# Patient Record
Sex: Male | Born: 1965 | Race: White | Hispanic: No | Marital: Married | Smoking: Current some day smoker
Health system: Southern US, Community
[De-identification: ages and names within clinical notes are randomized; demographics above are authoritative.]

## PROBLEM LIST (undated history)

## (undated) DIAGNOSIS — R011 Cardiac murmur, unspecified: Secondary | ICD-10-CM

## (undated) DIAGNOSIS — T7840XA Allergy, unspecified, initial encounter: Secondary | ICD-10-CM

## (undated) HISTORY — PX: CHOLECYSTECTOMY: SHX55

## (undated) HISTORY — DX: Cardiac murmur, unspecified: R01.1

## (undated) HISTORY — DX: Allergy, unspecified, initial encounter: T78.40XA

## (undated) HISTORY — PX: APPENDECTOMY: SHX54

---

## 1997-07-08 ENCOUNTER — Encounter: Admission: RE | Admit: 1997-07-08 | Discharge: 1997-10-06 | Payer: Self-pay

## 1997-07-20 ENCOUNTER — Encounter: Admission: RE | Admit: 1997-07-20 | Discharge: 1997-07-20 | Payer: Self-pay | Admitting: Family Medicine

## 1997-08-16 ENCOUNTER — Encounter: Admission: RE | Admit: 1997-08-16 | Discharge: 1997-08-16 | Payer: Self-pay | Admitting: Family Medicine

## 2009-07-19 ENCOUNTER — Telehealth: Payer: Self-pay | Admitting: Cardiovascular Disease

## 2010-05-11 NOTE — Progress Notes (Signed)
Summary: question  Phone Note Outgoing Call   Call placed by: Cala Bradford  Call placed to: Patient Summary of Call: called pt to see if he was going to con't to follow with gollan pt said that he would see gollan here in [place]. he will go and sign records @ [facility] and then we can call and have his appt set up for sometime in May  pt phone number [phone] Initial call taken by: Mercer Pod,  July 19, 2009 4:36 PM

## 2014-09-20 ENCOUNTER — Ambulatory Visit (INDEPENDENT_AMBULATORY_CARE_PROVIDER_SITE_OTHER): Payer: Managed Care, Other (non HMO)

## 2014-09-20 ENCOUNTER — Ambulatory Visit (INDEPENDENT_AMBULATORY_CARE_PROVIDER_SITE_OTHER): Payer: Managed Care, Other (non HMO) | Admitting: Family Medicine

## 2014-09-20 VITALS — BP 120/80 | HR 53 | Temp 98.8°F | Resp 12 | Ht 67.0 in | Wt 176.1 lb

## 2014-09-20 DIAGNOSIS — R0789 Other chest pain: Secondary | ICD-10-CM | POA: Diagnosis not present

## 2014-09-20 DIAGNOSIS — R05 Cough: Secondary | ICD-10-CM | POA: Diagnosis not present

## 2014-09-20 DIAGNOSIS — R059 Cough, unspecified: Secondary | ICD-10-CM

## 2014-09-20 MED ORDER — HYDROCODONE-HOMATROPINE 5-1.5 MG/5ML PO SYRP: 5.0000 mL | ORAL_SOLUTION | Freq: Three times a day (TID) | ORAL | Status: DC | PRN

## 2014-09-20 MED ORDER — AZITHROMYCIN 250 MG PO TABS: ORAL_TABLET | ORAL | Status: DC

## 2014-09-20 NOTE — Patient Instructions (Signed)

## 2014-09-20 NOTE — Progress Notes (Signed)
Patient ID: Alexander Benitez MRN: [id number], DOB: Nov 17, 1965, 49 y.o. Date of Encounter: 09/20/2014, 6:23 PM  Primary Physician: No PCP Per Patient  Chief Complaint:  Chief Complaint  Patient presents with  . Cough    with chest congestion x 3 weeks-worse at night  . Wheezing    HPI: 49 y.o. year old male presents with a 21 day history of nasal congestion, post nasal drip, sore throat, and cough. Mild sinus pressure. Afebrile. No chills. Nasal congestion thick and green/yellow. Cough is productive of green/yellow sputum and associated with bedtime. Ears feel full, leading to sensation of muffled hearing. Has tried OTC cold preps without success. No GI complaints.   No sick contacts, recent antibiotics, or recent travels.   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  No h/o asthma.  No leg trauma, sedentary periods, h/o cancer, or tobacco use(quit years ago)  Past Medical History  Diagnosis Date  . Allergy   . Heart murmur      Home Meds: Prior to Admission medications   Medication Sig Start Date End Date Taking? Authorizing Provider  triamcinolone (NASACORT ALLERGY 24HR) 55 MCG/ACT AERO nasal inhaler Place 2 sprays into the nose daily.   Yes Historical Provider, MD    Allergies: No Known Allergies  History   Social History  . Marital Status: Married    Spouse Name: N/A  . Number of Children: N/A  . Years of Education: N/A   Occupational History  . Not on file.   Social History Main Topics  . Smoking status: Never Smoker   . Smokeless tobacco: Former Neurosurgeon  . Alcohol Use: 2.4 - 3.6 oz/week    4-6 Standard drinks or equivalent per week  . Drug Use: No  . Sexual Activity: Not on file   Other Topics Concern  . Not on file   Social History Narrative  . No narrative on file     Review of Systems: Constitutional: negative for chills, fever, night sweats or weight changes Cardiovascular: negative for chest pain or palpitations Respiratory: negative for  hemoptysis, wheezing, or shortness of breath Abdominal: negative for abdominal pain, nausea, vomiting or diarrhea Dermatological: negative for rash Neurologic: negative for headache   Physical Exam: Blood pressure 120/80, pulse 53, temperature 98.8 F (37.1 C), temperature source Oral, resp. rate 12, height 5\' 7"  (1.702 m), weight 176 lb 2 oz (79.89 kg), SpO2 98 %., Body mass index is 27.58 kg/(m^2). General: Well developed, well nourished, in no acute distress. Head: Normocephalic, atraumatic, eyes without discharge, sclera non-icteric, nares are congested. Bilateral auditory canals clear, TM's are without perforation, pearly grey with reflective cone of light bilaterally. No sinus TTP. Oral cavity moist, dentition normal. Posterior pharynx with post nasal drip and mild erythema. No peritonsillar abscess or tonsillar exudate. Neck: Supple. No thyromegaly. Full ROM. No lymphadenopathy. Lungs: Coarse breath sounds bilaterally with wheezes, rales, and rhonchi bilaterally, worse on the right. Breathing is unlabored.  Heart: RRR with S1 S2. No murmurs, rubs, or gallops appreciated. Msk:  Strength and tone normal for age. Extremities: No clubbing or cyanosis. No edema. Neuro: Alert and oriented X 3. Moves all extremities spontaneously. CNII-XII grossly in tact. Psych:  Responds to questions appropriately with a normal affect.   [facility] reading (PRIMARY) by  Dr. Milus Glazier. Chest x-ray shows peribronchial cuffing and some heavier than normal markings but no definite infiltrate or cardiomegaly   ASSESSMENT AND PLAN:  49 y.o. year old male with bronchitis. -   ICD-9-CM  ICD-10-CM   1. Cough 786.2 R05 DG Chest 2 View     azithromycin (ZITHROMAX Z-PAK) 250 MG tablet     HYDROcodone-homatropine (HYCODAN) 5-1.5 MG/5ML syrup  2. Chest pressure 786.59 R07.89 DG Chest 2 View     azithromycin (ZITHROMAX Z-PAK) 250 MG tablet   -Tylenol/Motrin prn -Rest/fluids -RTC precautions -RTC 3-5 days if no  improvement  Signed, Elvina Sidle, MD 09/20/2014 6:23 PM

## 2015-01-03 ENCOUNTER — Ambulatory Visit (INDEPENDENT_AMBULATORY_CARE_PROVIDER_SITE_OTHER): Payer: Managed Care, Other (non HMO)

## 2015-01-03 ENCOUNTER — Ambulatory Visit (INDEPENDENT_AMBULATORY_CARE_PROVIDER_SITE_OTHER): Payer: Managed Care, Other (non HMO) | Admitting: Family Medicine

## 2015-01-03 VITALS — BP 136/82 | HR 53 | Temp 98.3°F | Resp 16 | Ht 68.5 in | Wt 179.0 lb

## 2015-01-03 DIAGNOSIS — R079 Chest pain, unspecified: Secondary | ICD-10-CM | POA: Diagnosis not present

## 2015-01-03 DIAGNOSIS — R0789 Other chest pain: Secondary | ICD-10-CM

## 2015-01-03 DIAGNOSIS — R001 Bradycardia, unspecified: Secondary | ICD-10-CM

## 2015-01-03 DIAGNOSIS — I340 Nonrheumatic mitral (valve) insufficiency: Secondary | ICD-10-CM

## 2015-01-03 DIAGNOSIS — I341 Nonrheumatic mitral (valve) prolapse: Secondary | ICD-10-CM | POA: Diagnosis not present

## 2015-01-03 LAB — COMPLETE METABOLIC PANEL WITHOUT GFR
Albumin: 5 g/dL (ref 3.6–5.1)
Chloride: 100 mmol/L (ref 98–110)
Glucose, Bld: 78 mg/dL (ref 65–99)
Potassium: 4.3 mmol/L (ref 3.5–5.3)
Sodium: 137 mmol/L (ref 135–146)

## 2015-01-03 LAB — LIPID PANEL
Cholesterol: 203 mg/dL — ABNORMAL HIGH (ref 125–200)
HDL: 44 mg/dL (ref >=40)
LDL Cholesterol: 139 mg/dL — ABNORMAL HIGH (ref <130)
Total CHOL/HDL Ratio: 4.6 Ratio (ref <=5.0)
Triglycerides: 99 mg/dL (ref <150)
VLDL: 20 mg/dL (ref <30)

## 2015-01-03 LAB — POCT CBC
Granulocyte percent: 71.9 % (ref 37–80)
HCT, POC: 50.7 % (ref 43.5–53.7)
Hemoglobin: 16.8 g/dL (ref 14.1–18.1)
Lymph, poc: 2.9 (ref 0.6–3.4)
MCH, POC: 31.1 pg (ref 27–31.2)
MCHC: 33.1 g/dL (ref 31.8–35.4)
MCV: 93.8 fL (ref 80–97)
MID (cbc): 0.5 (ref 0–0.9)
MPV: 6.8 fL (ref 0–99.8)
POC Granulocyte: 8.8 — AB (ref 2–6.9)
POC LYMPH PERCENT: 24.1 %L (ref 10–50)
POC MID %: 4 %M (ref 0–12)
Platelet Count, POC: 271 10*3/uL (ref 142–424)
RBC: 5.41 M/uL (ref 4.69–6.13)
RDW, POC: 13.7 %
WBC: 12.2 10*3/uL — AB (ref 4.6–10.2)

## 2015-01-03 LAB — COMPLETE METABOLIC PANEL WITH GFR
ALT: 37 U/L (ref 9–46)
AST: 27 U/L (ref 10–40)
Alkaline Phosphatase: 76 U/L (ref 40–115)
BUN: 10 mg/dL (ref 7–25)
CO2: 30 mmol/L (ref 20–31)
Calcium: 10 mg/dL (ref 8.6–10.3)
Creat: 0.81 mg/dL (ref 0.60–1.35)
GFR, Est African American: >89 mL/min (ref >=60)
GFR, Est Non African American: >89 mL/min (ref >=60)
Total Bilirubin: 0.8 mg/dL (ref 0.2–1.2)
Total Protein: 7.5 g/dL (ref 6.1–8.1)

## 2015-01-03 NOTE — Patient Instructions (Signed)
Chest Pain (Nonspecific) It is often hard to give a specific diagnosis for the cause of chest pain. There is always a chance that your pain could be related to something serious, such as a heart attack or a blood clot in the lungs. You need to follow up with your health care provider for further evaluation. CAUSES   Heartburn.  Pneumonia or bronchitis.  Anxiety or stress.  Inflammation around your heart (pericarditis) or lung (pleuritis or pleurisy).  A blood clot in the lung.  A collapsed lung (pneumothorax). It can develop suddenly on its own (spontaneous pneumothorax) or from trauma to the chest.  Shingles infection (herpes zoster virus). The chest wall is composed of bones, muscles, and cartilage. Any of these can be the source of the pain.  The bones can be bruised by injury.  The muscles or cartilage can be strained by coughing or overwork.  The cartilage can be affected by inflammation and become sore (costochondritis). DIAGNOSIS  Lab tests or other studies may be needed to find the cause of your pain. Your health care provider may have you take a test called an ambulatory electrocardiogram (ECG). An ECG records your heartbeat patterns over a 24-hour period. You may also have other tests, such as:  Transthoracic echocardiogram (TTE). During echocardiography, sound waves are used to evaluate how blood flows through your heart.  Transesophageal echocardiogram (TEE).  Cardiac monitoring. This allows your health care provider to monitor your heart rate and rhythm in real time.  Holter monitor. This is a portable device that records your heartbeat and can help diagnose heart arrhythmias. It allows your health care provider to track your heart activity for several days, if needed.  Stress tests by exercise or by giving medicine that makes the heart beat faster. TREATMENT   Treatment depends on what may be causing your chest pain. Treatment may include:  Acid blockers for  heartburn.  Anti-inflammatory medicine.  Pain medicine for inflammatory conditions.  Antibiotics if an infection is present.  You may be advised to change lifestyle habits. This includes stopping smoking and avoiding alcohol, caffeine, and chocolate.  You may be advised to keep your head raised (elevated) when sleeping. This reduces the chance of acid going backward from your stomach into your esophagus. Most of the time, nonspecific chest pain will improve within 2-3 days with rest and mild pain medicine.  HOME CARE INSTRUCTIONS   If antibiotics were prescribed, take them as directed. Finish them even if you start to feel better.  For the next few days, avoid physical activities that bring on chest pain. Continue physical activities as directed.  Do not use any tobacco products, including cigarettes, chewing tobacco, or electronic cigarettes.  Avoid drinking alcohol.  Only take medicine as directed by your health care provider.  Follow your health care provider's suggestions for further testing if your chest pain does not go away.  Keep any follow-up appointments you made. If you do not go to an appointment, you could develop lasting (chronic) problems with pain. If there is any problem keeping an appointment, call to reschedule. SEEK MEDICAL CARE IF:   Your chest pain does not go away, even after treatment.  You have a rash with blisters on your chest.  You have a fever. SEEK IMMEDIATE MEDICAL CARE IF:   You have increased chest pain or pain that spreads to your arm, neck, jaw, back, or abdomen.  You have shortness of breath.  You have an increasing cough, or you cough  up blood.  You have severe back or abdominal pain.  You feel nauseous or vomit.  You have severe weakness.  You faint.  You have chills. This is an emergency. Do not wait to see if the pain will go away. Get medical help at once. Call your local emergency services (911 in [place] Do not drive  yourself to the hospital. MAKE SURE YOU:   Understand these instructions.  Will watch your condition.  Will get help right away if you are not doing well or get worse. Document Released: 01/03/2005 Document Revised: 03/31/2013 Document Reviewed: 10/30/2007 [facility] Patient Information 2015 [place], [place]. This information is not intended to replace advice given to you by your health care provider. Make sure you discuss any questions you have with your health care provider. Mitral Valve Prolapse The mitral valve is located between the top and bottom parts of the heart on the left side. A mitral valve prolapse (MVP) is an abnormal bulging of 1 or both of the 2 mitral leaflets. The valve bulges into the top chamber (atrium) of the heart when the bottom chamber (ventricle) squeezes or contracts. MVP is more common in females. It is an inherited problem and is usually not found until adolescence. It is not harmful and rarely needs other treatment. PROBLEMS MAY INCLUDE:  Chest pain.  Palpitations.  Anxiety.  Panic attacks.  Stroke, rarely. HOME CARE INSTRUCTIONS   Taking antibiotics before a dental or other medical procedure is no longer routine. Consult with your caregiver.  Exercise as your caregiver instructs.  Discuss cardiac risk factors associated with MVP with your caregiver. SEEK IMMEDIATE MEDICAL CARE IF:   You develop frequent episodes of chest pain or an irregular heartbeat.  You faint or pass out.  You have severe chest pain or shortness of breath.  You develop palpitations with weakness or dizziness.  You have difficulty with vision or swallowing or weakness or numbness on one side of your body. MAKE SURE YOU:   Understand these instructions.  Will watch your condition.  Will get help right away if you are not doing well or get worse. Document Released: 03/23/2000 Document Revised: 06/18/2011 Document Reviewed: 05/23/2007 [facility] Patient Information 2015  [place], [place]. This information is not intended to replace advice given to you by your health care provider. Make sure you discuss any questions you have with your health care provider.

## 2015-01-03 NOTE — Progress Notes (Signed)
Chief Complaint:  Chief Complaint  Patient presents with  . Abdominal Pain    mid-sternum X 1 week    HPI: Alexander Benitez is a 49 y.o. male who reports to [facility] today complaining of midchest pain radiating to both sides starting about 9 days ago, he has no other sxs. It starts at his xiphoid and primarily goes to the right right side. He has a hx of a heart murmur but denies any HA, n/v/ weakness, diaphoresis, SOB, leg swelling. He is not sure if he has palpitations. He has a labor intensive job so not sure if he overexerted himself. He works in Dentist, he lifts both  light and heavy things. He used to run but has not been able to. He has had issues with heart burn in the pat. He has woken  up a lot at night sweating. He has been doing this for about  1 year off and on. He stopped smoking 5 years ago and was smoking about 20 years x 1 ppd. He works in [employer] , exposed to phosphoric acid. He has had no unintentonal weight loss, he had a cough several months ago andthis took a couple months to resolved even after abx. He was given a z pack and after a couple of weeks after that it was finely  better.  He has a sharp 4/10 pain when he bends over and then it resolves. He has no CHF sxs.   Biological father had 3 heart attacks at age 28.  He also had liver disease and so did his father's sister and mother, he does not know what it is called.  He has minimally high choleseterol which was tested through his work, marginally high. He denies diabetes.  He has been evaluated by cardiology in the past.   We were able to pull his old chart and he was seen by Dr Gunnar Bulla at [facility] in [place] several years back  And had a stress echo and was found to have mitral regurgiation, with MVP, trace tricuspid and also pulmonic regurgiationa s well.  He has not followed up with him for awhile since the practice is closed? He was put on metoprolol but that made  his heart rate go below 45 and he felt awful.     Past Medical History  Diagnosis Date  . Allergy   . Heart murmur    Past Surgical History  Procedure Laterality Date  . Appendectomy    . Cholecystectomy     Social History   Social History  . Marital Status: Married    Spouse Name: N/A  . Number of Children: N/A  . Years of Education: N/A   Social History Main Topics  . Smoking status: Never Smoker   . Smokeless tobacco: Former Neurosurgeon  . Alcohol Use: 2.4 - 3.6 oz/week    4-6 Standard drinks or equivalent per week  . Drug Use: No  . Sexual Activity: Not Asked   Other Topics Concern  . None   Social History Narrative   Family History  Problem Relation Age of Onset  . Adopted: Yes  . Cancer Father   . Heart disease Father    No Known Allergies Prior to Admission medications   Medication Sig Start Date End Date Taking? Authorizing Provider  azithromycin (ZITHROMAX Z-PAK) 250 MG tablet 2-1-1-1-1 daily Patient not taking: Reported on 01/03/2015 09/20/14   Elvina Sidle, MD  HYDROcodone-homatropine [facility]) 5-1.5 MG/5ML  syrup Take 5 mLs by mouth every 8 (eight) hours as needed for cough. Patient not taking: Reported on 01/03/2015 09/20/14   Elvina Sidle, MD  triamcinolone (NASACORT ALLERGY 24HR) 55 MCG/ACT AERO nasal inhaler Place 2 sprays into the nose daily.    Historical Provider, MD     ROS: The patient denies fevers, chills,  unintentional weight loss,  palpitations, wheezing, dyspnea on exertion, nausea, vomiting, dysuria, hematuria, melena, numbness, weakness, or tingling.  All other systems have been reviewed and were otherwise negative with the exception of those mentioned in the HPI and as above.    PHYSICAL EXAM: Filed Vitals:   01/03/15 1626  BP: 136/82  Pulse: 53  Temp: 98.3 F (36.8 C)  Resp: 16   Body mass index is 26.82 kg/(m^2).   General: Alert, no acute distress HEENT:  Normocephalic, atraumatic, oropharynx patent. EOMI, PERRLA, fundo  exam ormal, Tm nl, no thyroidmegaly Cardiovascular:  Sinus brady, reg rhythm, no rubs or gallops.  + 3/6 systolic murmur with click. No Carotid bruits, radial pulse intact. No pedal edema.  Respiratory: Clear to auscultation bilaterally.  No wheezes, rales, or rhonchi.  No cyanosis, no use of accessory musculature Abdominal: No organomegaly, abdomen is soft and non-tender, positive bowel sounds. No masses. Skin: No rashes. Neurologic: Facial musculature symmetric. CN 2-12 grossly normal  Psychiatric: Patient acts appropriately throughout our interaction. Lymphatic: No cervical or submandibular lymphadenopathy Musculoskeletal: Gait intact. No edema, tenderness + tenderness at xiphoid but not on chest wall on palpation Diaphragm movement is normal bilateally   LABS: Results for orders placed or performed in visit on 01/03/15  POCT CBC  Result Value Ref Range   WBC 12.2 (A) 4.6 - 10.2 K/uL   Lymph, poc 2.9 0.6 - 3.4   POC LYMPH PERCENT 24.1 10 - 50 %L   MID (cbc) 0.5 0 - 0.9   POC MID % 4.0 0 - 12 %M   POC Granulocyte 8.8 (A) 2 - 6.9   Granulocyte percent 71.9 37 - 80 %G   RBC 5.41 4.69 - 6.13 M/uL   Hemoglobin 16.8 14.1 - 18.1 g/dL   HCT, POC 16.1 09.6 - 53.7 %   MCV 93.8 80 - 97 fL   MCH, POC 31.1 27 - 31.2 pg   MCHC 33.1 31.8 - 35.4 g/dL   RDW, POC 04.5 %   Platelet Count, POC 271 142 - 424 K/uL   MPV 6.8 0 - 99.8 fL     EKG/XRAY:   Primary read interpreted by Dr. Conley Rolls at [facility]. No acute cardiopulm process EKG sinus brady without ST changes, he has sinus brady on prior EKG as well   ASSESSMENT/PLAN: Encounter Diagnoses  Name Primary?  . Chest pain, unspecified chest pain type   . Atypical chest pain Yes  . Sinus bradycardia   . Mitral valve regurgitation   . Mitral valve prolapse    Refer to Cardiology  Labs pending Go to ER prn , precautions given.  Fu prn   Gross sideeffects, risk and benefits, and alternatives of medications d/w patient. Patient is aware that  all medications have potential sideeffects and we are unable to predict every sideeffect or drug-drug interaction that may occur.  Thao Le DO  01/03/2015 6:01 PM   LM about labs, needs referral to cardiology.

## 2015-01-04 DIAGNOSIS — R001 Bradycardia, unspecified: Secondary | ICD-10-CM | POA: Insufficient documentation

## 2015-01-04 DIAGNOSIS — I340 Nonrheumatic mitral (valve) insufficiency: Secondary | ICD-10-CM | POA: Insufficient documentation

## 2015-01-04 DIAGNOSIS — I341 Nonrheumatic mitral (valve) prolapse: Secondary | ICD-10-CM | POA: Insufficient documentation

## 2015-01-04 LAB — TSH: TSH: 0.722 u[IU]/mL (ref 0.350–4.500)

## 2015-01-06 ENCOUNTER — Telehealth: Payer: Self-pay | Admitting: *Deleted

## 2015-01-06 NOTE — Telephone Encounter (Signed)
lmov for pt to call and make referral apt.

## 2015-03-07 ENCOUNTER — Ambulatory Visit: Payer: Self-pay | Admitting: Cardiovascular Disease

## 2015-06-04 ENCOUNTER — Ambulatory Visit (INDEPENDENT_AMBULATORY_CARE_PROVIDER_SITE_OTHER): Payer: Managed Care, Other (non HMO) | Admitting: Internal Medicine

## 2015-06-04 VITALS — BP 130/78 | HR 48 | Temp 98.2°F | Resp 12 | Ht 68.0 in | Wt 183.0 lb

## 2015-06-04 DIAGNOSIS — L290 Pruritus ani: Secondary | ICD-10-CM | POA: Diagnosis not present

## 2015-06-04 MED ORDER — CLOTRIMAZOLE-BETAMETHASONE 1-0.05 % EX CREA: 1.0000 "application " | TOPICAL_CREAM | Freq: Two times a day (BID) | CUTANEOUS | Status: DC

## 2015-06-04 NOTE — Progress Notes (Signed)
   Subjective:  By signing my name below, I, [name], attest that this documentation has been prepared under the direction and in the presence of Ellamae Sia, MD.  Electronically Signed: Andrew Au, ED Scribe. 06/04/2015. 12:11 PM.    Patient ID: Alexander Benitez, male    DOB: [birth year], 50 y.o.   MRN: [id number]  HPI1st [facility] ov Chief Complaint  Patient presents with  . Abdominal Pain    1 month cramping  . Diarrhea    1 month    HPI Comments: Alexander Benitez is a 50 y.o. male who presents to the [facility] complaining of anal irritation. Pt reports itching to the anal area, worse in the morning when showering. He reports fluctuating between constipation and normal stools. He has hx of hemorrhoids, last flare summer 2016. He has tried preparation H, A&D ointment and keeping areas clean with wipes with temporary relief. He has not had colonoscopy. He denies family hx of colon cancer. He denies pertinent medical hx including HTN, DM and HLD.   Past Medical History  Diagnosis Date  . Allergy   . Heart murmur    Past Surgical History  Procedure Laterality Date  . Appendectomy    . Cholecystectomy     Prior to Admission medications   Medication Sig Start Date End Date Taking? Authorizing Provider  triamcinolone (NASACORT ALLERGY 24HR) 55 MCG/ACT AERO nasal inhaler Place 2 sprays into the nose daily.   Yes Historical Provider, MD  clotrimazole-betamethasone (LOTRISONE) cream Apply 1 application topically 2 (two) times daily. 06/04/15   Tonye Pearson, MD   Review of Systems  Constitutional: Negative for fever and chills.  Gastrointestinal: Positive for constipation. Negative for diarrhea.  Skin: Positive for rash.   Objective:   Physical Exam  Constitutional: He is oriented to person, place, and time. He appears well-developed and well-nourished. No distress.  HENT:  Head: Normocephalic and atraumatic.  Eyes: Conjunctivae and EOM are normal.  Neck:  Neck supple.  Cardiovascular: Normal rate.   Pulmonary/Chest: Effort normal.  Genitourinary:  Dry, irritated, perianal region without lesions  Musculoskeletal: Normal range of motion.  Neurological: He is alert and oriented to person, place, and time.  Skin: Skin is warm and dry.  Psychiatric: He has a normal mood and affect. His behavior is normal.  Nursing note and vitals reviewed.   Filed Vitals:   06/04/15 1103  BP: 130/78  Pulse: 48  Temp: 98.2 F (36.8 C)  TempSrc: Oral  Resp: 12  Height:  (1.727 m)  Weight: 183 lb (83.008 kg)  SpO2: 98%       Assessment & Plan:   1. Pruritus ani     Meds ordered this encounter  Medications  . clotrimazole-betamethasone (LOTRISONE) cream for 2 weeks    Sig: Apply 1 application topically 2 (two) times daily.    Dispense:  30 g    Refill:  0  baby wipes p bm A&D after stool  I have completed the patient encounter in its entirety as documented by the scribe, with editing by me where necessary. Kaydon Husby P. Merla Riches, M.D.

## 2015-07-09 ENCOUNTER — Ambulatory Visit (INDEPENDENT_AMBULATORY_CARE_PROVIDER_SITE_OTHER): Payer: Managed Care, Other (non HMO) | Admitting: Family Medicine

## 2015-07-09 VITALS — BP 147/83 | HR 48 | Temp 97.6°F | Resp 16 | Ht 68.0 in | Wt 190.2 lb

## 2015-07-09 DIAGNOSIS — L237 Allergic contact dermatitis due to plants, except food: Secondary | ICD-10-CM | POA: Diagnosis not present

## 2015-07-09 MED ORDER — METHYLPREDNISOLONE ACETATE 80 MG/ML IJ SUSP
120.0000 mg | Freq: Once | INTRAMUSCULAR | Status: AC
  Administered 2015-07-09: 120 mg via INTRAMUSCULAR

## 2015-07-09 NOTE — Progress Notes (Signed)
Patient ID: Alexander Benitez MRN: [id number], DOB: [birth year], 50 y.o. Date of Encounter: 07/09/2015, 11:25 AM  Primary Physician: Rockne Coons, DO  Chief Complaint: Pruritic rash  HPI: 50 y.o. year old male with presents with 3 day history of mildly erythematous pruritic rash along arms and face. Patient was doing yard work prior to the development of the rash. Known poison ivy in the vicinity. Has not yet washed all clothing or linens that have been exposed. Lesions now weeping clear fluid. Has tried OTC creams.  Patient is otherwise doing well without issues or complaints.  Past Medical History  Diagnosis Date  . Allergy   . Heart murmur      Home Meds: Prior to Admission medications   Medication Sig Start Date End Date Taking? Authorizing Provider  clotrimazole-betamethasone (LOTRISONE) cream Apply 1 application topically 2 (two) times daily. Patient not taking: Reported on 07/09/2015 06/04/15   Tonye Pearson, MD  triamcinolone (NASACORT ALLERGY 24HR) 55 MCG/ACT AERO nasal inhaler Place 2 sprays into the nose daily. Reported on 07/09/2015    Historical Provider, MD    Allergies: No Known Allergies  Social History   Social History  . Marital Status: Married    Spouse Name: N/A  . Number of Children: N/A  . Years of Education: N/A   Occupational History  . Not on file.   Social History Main Topics  . Smoking status: Never Smoker   . Smokeless tobacco: Former Neurosurgeon  . Alcohol Use: 2.4 - 3.6 oz/week    4-6 Standard drinks or equivalent per week  . Drug Use: No  . Sexual Activity: Not on file   Other Topics Concern  . Not on file   Social History Narrative     Review of Systems: Constitutional: negative for chills, fever, night sweats, weight changes, or fatigue  HEENT: negative for vision changes, hearing loss, congestion, rhinorrhea, ST, epistaxis, or sinus pressure Cardiovascular: negative for chest pain or palpitations Respiratory: negative for hemoptysis,  wheezing, shortness of breath, or cough Abdominal: negative for abdominal pain, nausea, vomiting, diarrhea, or constipation Dermatological: see above Neurologic: negative for headache, dizziness, or syncope   Physical Exam: Blood pressure 147/83, pulse 48, temperature 97.6 F (36.4 C), temperature source Oral, resp. rate 16, height  (1.727 m), weight 190 lb 3.2 oz (86.274 kg), SpO2 98 %., Body mass index is 28.93 kg/(m^2). General: Well developed, well nourished, in no acute distress. Head: Normocephalic, atraumatic, eyes without discharge, sclera non-icteric, nares are without discharge. Bilateral auditory canals clear, TM's are without perforation, pearly grey and translucent with reflective cone of light bilaterally. Oral cavity moist, posterior pharynx without exudate, erythema, peritonsillar abscess, or post nasal drip.  Neck: Supple. No thyromegaly. Full ROM. No lymphadenopathy. Lungs: Clear bilaterally to auscultation without wheezes, rales, or rhonchi. Breathing is unlabored. Heart: RRR with S1 S2. No murmurs, rubs, or gallops appreciated. Msk:  Strength and tone normal for age. Extremities/Skin: Warm and dry. Multiple vesicular weeping lesions along forearms, wrists, and both upper lids(worse on left) consistent with poison ivy. No secondary infections present. No clubbing or cyanosis. No edema.  Neuro: Alert and oriented X 3. Moves all extremities spontaneously. Gait is normal. CNII-XII grossly in tact. Psych:  Responds to questions appropriately with a normal affect.     ASSESSMENT AND PLAN:  50 y.o. year old male with poison ivy involving forearms, face and wrists     ICD-9-CM ICD-10-CM   1. Poison ivy 692.6 L23.7 methylPREDNISolone acetate (  DEPO-MEDROL) injection 120 mg   -Zyrtec -Zantac -Benadryl -Wash all contaminated clothes and linens -RTC 10 days if symptoms persist, sooner if they worsen   Signed, Elvina SidleKurt Katlen Seyer, MD 07/09/2015 11:25 AM

## 2015-07-09 NOTE — Patient Instructions (Addendum)

## 2015-12-02 ENCOUNTER — Encounter: Payer: Self-pay | Admitting: Physician Assistant

## 2015-12-02 ENCOUNTER — Ambulatory Visit (INDEPENDENT_AMBULATORY_CARE_PROVIDER_SITE_OTHER): Payer: Managed Care, Other (non HMO) | Admitting: Physician Assistant

## 2015-12-02 VITALS — BP 148/84 | HR 52 | Temp 98.5°F | Resp 17 | Ht 68.5 in | Wt 182.0 lb

## 2015-12-02 DIAGNOSIS — Z79899 Other long term (current) drug therapy: Secondary | ICD-10-CM | POA: Diagnosis not present

## 2015-12-02 DIAGNOSIS — B351 Tinea unguium: Secondary | ICD-10-CM | POA: Diagnosis not present

## 2015-12-02 MED ORDER — TERBINAFINE HCL 250 MG PO TABS: 250.0000 mg | ORAL_TABLET | Freq: Every day | ORAL | 1 refills | Status: DC

## 2015-12-02 NOTE — Progress Notes (Signed)
[facility] [address] [place] [address] [phone] By signing my name below, I, Mesha Guinyard, attest that this documentation has been prepared under the direction and in the presence of Treatment Team:  Attending Provider: Ethelda Chick, MD Physician Assistant: Garnetta Buddy, PA.  Electronically Signed: Arvilla Market, Medical Scribe. 12/02/15. 4:05 PM.  Date:  12/02/2015   Name:  Alexander Benitez   DOB:  Sep 29, 1965   MRN:  [id number]  PCP:  Rockne Coons, DO   Chief Complaint  Patient presents with   Other    nail and toe nail infection    History of Present Illness:  Alexander Benitez is a 50 y.o. male patient who presents to [facility] complaining of left thumb nail and left great toe nail fungus onset a couple of months. Pt noticed his toe nail was white and flaky so he gave him self a pedicure, and a couple of weeks ago his thumb nail started looking fungal like his left great toe. Pt mentions it's starting to spread to his other two adjacent fingers on his left hand. Pt's left thumb nail was pulled off years ago and grew back fine in 6 months after the accident. Pt has tried a OTC fungal medicine that gave no relief to his symptoms. Pt is adopted and his biological dad died of liver disease, but he isn't sure if it was alcohol related. Pt drinks about 3-4 drinks, 2-3 times a week. Pt denies having hx of liver issues.  Patient Active Problem List   Diagnosis Date Noted   Mitral valve regurgitation 01/04/2015   Mitral valve prolapse 01/04/2015   Sinus bradycardia 01/04/2015    Past Medical History:  Diagnosis Date   Allergy    Heart murmur     Past Surgical History:  Procedure Laterality Date   APPENDECTOMY     CHOLECYSTECTOMY      Social History  Substance Use Topics   Smoking status: Current Some Day Smoker   Smokeless tobacco: Former Neurosurgeon   Alcohol use 2.4 - 3.6 oz/week    4 - 6 Standard drinks or equivalent per week     Family History  Problem Relation Age of Onset   Adopted: Yes   Cancer Father    Heart disease Father     No Known Allergies  Medication list has been reviewed and updated.  Current Outpatient Prescriptions on File Prior to Visit  Medication Sig Dispense Refill   triamcinolone (NASACORT ALLERGY 24HR) 55 MCG/ACT AERO nasal inhaler Place 2 sprays into the nose daily. Reported on 07/09/2015     No current facility-administered medications on file prior to visit.     Review of Systems  Skin:       Left great toe nail, and left thumb nail fungus      Physical Examination: BP (!) 148/84 (BP Location: Right Arm, Patient Position: Sitting, Cuff Size: Normal)    Pulse (!) 52    Temp 98.5 F (36.9 C) (Oral)    Resp 17    Ht 5' 8.5" (1.74 m)    Wt 182 lb (82.6 kg)    SpO2 96%    BMI 27.27 kg/m  Ideal Body Weight: @FLOWAMB ([id number])@  Physical Exam  Constitutional: He is oriented to person, place, and time. He appears well-developed and well-nourished. No distress.  HENT:  Head: Normocephalic and atraumatic.  Eyes: Conjunctivae and EOM are normal. Pupils are equal, round, and reactive to light.  Cardiovascular: Normal rate.   Pulmonary/Chest: Effort normal. No respiratory distress.  Neurological: He is alert and oriented to person, place, and time.  Skin: Skin is warm and dry. He is not diaphoretic.  Left first toe with thickened toenail There mild yellowing  No tenderness Left thumb with thickened nail that seems to be elevated off the nail bed and hyperkeratotic, this is also the problem of the left 5th toe  Psychiatric: He has a normal mood and affect. His behavior is normal.   Results for orders placed or performed in visit on 12/02/15  COMPLETE METABOLIC PANEL WITH GFR  Result Value Ref Range   Sodium 139 135 - 146 mmol/L   Potassium 4.0 3.5 - 5.3 mmol/L   Chloride 105 98 - 110 mmol/L   CO2 24 20 - 31 mmol/L   Glucose, Bld 93 65 - 99 mg/dL   BUN 11 7 - 25 mg/dL    Creat 1.610.80 0.960.60 - 0.451.35 mg/dL   Total Bilirubin 0.7 0.2 - 1.2 mg/dL   Alkaline Phosphatase 84 40 - 115 U/L   AST 29 10 - 40 U/L   ALT 46 9 - 46 U/L   Total Protein 7.5 6.1 - 8.1 g/dL   Albumin 5.0 3.6 - 5.1 g/dL   Calcium 40.910.2 8.6 - 81.110.3 mg/dL   GFR, Est African American >89 >=60 mL/min   GFR, Est Non African American >89 >=60 mL/min    Assessment and Plan: Alexander Benitez is a 50 y.o. male who is here today for cc of toe nail discoloration.   --advised that we will check liver enzymes at this time.  If elevated will advise to not take medication.  We will recheck in 6 weeks, hepatic panel and toe nail.  Patient voiced understanding.   Onychomycosis - Plan: COMPLETE METABOLIC PANEL WITH GFR, terbinafine (LAMISIL) 250 MG tablet, Care order/instruction:  Medication management - Plan: COMPLETE METABOLIC PANEL WITH GFR, Care order/instruction:  Trena PlattStephanie English, PA-C [facility] 12/02/2015 4:05 PM I personally performed the services described in this documentation, which was scribed in my presence. The recorded information has been reviewed and is accurate.

## 2015-12-02 NOTE — Patient Instructions (Addendum)
This is a 12 week course.  Take daily.  Please return in 6 weeks for recheck. Make sure that you are hydrating and eating fresh fruits and vegetables. Avoid alcohol intake at this time.  Nail Ringworm A fungal infection of the nail (tinea unguium/onychomycosis) is common. It is common as the visible part of the nail is composed of dead cells which have no blood supply to help prevent infection. It occurs because fungi are everywhere and will pick any opportunity to grow on any dead material. Because nails are very slow growing they require up to 2 years of treatment with anti-fungal medications. The entire nail back to the base is infected. This includes approximately  of the nail which you cannot see. If your caregiver has prescribed a medication by mouth, take it every day and as directed. No progress will be seen for at least 6 to 9 months. Do not be disappointed! Because fungi live on dead cells with little or no exposure to blood supply, medication delivery to the infection is slow; thus the cure is slow. It is also why you can observe no progress in the first 6 months. The nail becoming cured is the base of the nail, as it has the blood supply. Topical medication such as creams and ointments are usually not effective. Important in successful treatment of nail fungus is closely following the medication regimen that your doctor prescribes. Sometimes you and your caregiver may elect to speed up this process by surgical removal of all the nails. Even this may still require 6 to 9 months of additional oral medications. See your caregiver as directed. Remember there will be no visible improvement for at least 6 months. See your caregiver sooner if other signs of infection (redness and swelling) develop.   This information is not intended to replace advice given to you by your health care provider. Make sure you discuss any questions you have with your health care provider.   Document Released: 03/23/2000  Document Revised: 08/10/2014 Document Reviewed: 09/27/2014 Elsevier Interactive Patient Education 2016 [employer]    IF you received an x-ray today, you will receive an invoice from [facility]. Please contact [facility] at [phone] with questions or concerns regarding your invoice.   IF you received labwork today, you will receive an invoice from [facility]. Please contact [facility] at [phone] with questions or concerns regarding your invoice.   Our billing staff will not be able to assist you with questions regarding bills from these companies.  You will be contacted with the lab results as soon as they are available. The fastest way to get your results is to activate your My Chart account. Instructions are located on the last page of this paperwork. If you have not heard from us regarding the results in 2 weeks, please contact this office.

## 2015-12-03 LAB — COMPLETE METABOLIC PANEL WITH GFR
ALBUMIN: 5 g/dL (ref 3.6–5.1)
ALK PHOS: 84 U/L (ref 40–115)
ALT: 46 U/L (ref 9–46)
AST: 29 U/L (ref 10–40)
BUN: 11 mg/dL (ref 7–25)
CALCIUM: 10.2 mg/dL (ref 8.6–10.3)
CO2: 24 mmol/L (ref 20–31)
Chloride: 105 mmol/L (ref 98–110)
Creat: 0.8 mg/dL (ref 0.60–1.35)
GFR, Est African American: >89 mL/min (ref >=60)
Glucose, Bld: 93 mg/dL (ref 65–99)
POTASSIUM: 4 mmol/L (ref 3.5–5.3)
SODIUM: 139 mmol/L (ref 135–146)
Total Bilirubin: 0.7 mg/dL (ref 0.2–1.2)
Total Protein: 7.5 g/dL (ref 6.1–8.1)

## 2015-12-06 ENCOUNTER — Telehealth: Payer: Self-pay

## 2015-12-06 NOTE — Telephone Encounter (Signed)
Pt wants lab results - saw English last Friday.  [phone]

## 2015-12-07 NOTE — Telephone Encounter (Signed)
Liver and kidney function are normal.  Normal liver enzymes.  Please start the medication if you have not done so.  Follow up in 6 weeks for hepatic panel.

## 2015-12-07 NOTE — Telephone Encounter (Signed)
Please review and send to the lab please.

## 2015-12-08 NOTE — Telephone Encounter (Signed)
lmom with lab results and to call back with any questions or concerns.

## 2016-02-08 ENCOUNTER — Ambulatory Visit (INDEPENDENT_AMBULATORY_CARE_PROVIDER_SITE_OTHER): Payer: Managed Care, Other (non HMO) | Admitting: Physician Assistant

## 2016-02-08 VITALS — BP 124/78 | HR 55 | Temp 98.5°F | Resp 16 | Wt 185.0 lb

## 2016-02-08 DIAGNOSIS — B351 Tinea unguium: Secondary | ICD-10-CM

## 2016-02-08 DIAGNOSIS — Z79899 Other long term (current) drug therapy: Secondary | ICD-10-CM | POA: Diagnosis not present

## 2016-02-08 LAB — HEPATIC FUNCTION PANEL
ALBUMIN: 4.8 g/dL (ref 3.6–5.1)
ALT: 42 U/L (ref 9–46)
AST: 29 U/L (ref 10–40)
Alkaline Phosphatase: 77 U/L (ref 40–115)
BILIRUBIN DIRECT: 0.1 mg/dL (ref <=0.2)
BILIRUBIN TOTAL: 0.8 mg/dL (ref 0.2–1.2)
Indirect Bilirubin: 0.7 mg/dL (ref 0.2–1.2)
Total Protein: 7.3 g/dL (ref 6.1–8.1)

## 2016-02-08 MED ORDER — TERBINAFINE HCL 250 MG PO TABS: 250.0000 mg | ORAL_TABLET | Freq: Every day | ORAL | 0 refills | Status: AC

## 2016-02-08 NOTE — Patient Instructions (Signed)
Continue medication and follow up in 4-5 weeks.

## 2016-02-10 NOTE — Progress Notes (Signed)
[facility] [address], [place] [id number] [phone]  Date:  02/08/2016   Name:  Alexander PattyWalter W Benitez   DOB:  05/22/1965   MRN:  [id number]  PCP:  Rockne Coonshao Phuong Le, DO    History of Present Illness:  Alexander Benitez is a 50 y.o. male patient who [facility] medication refill and recheck of his toenails.  Patient was seen about 8 weeks ago for onychomycosis. He was given a hepatic function test before starting him on delayed terbinafine. He has been compliant with taking the medication. He states he only missed about 2 doses from the whole time. He states that he has noticed a change in his toenails and his fingernails however they continue to be mildly yellow.     Patient Active Problem List   Diagnosis Date Noted  . Mitral valve regurgitation 01/04/2015  . Mitral valve prolapse 01/04/2015  . Sinus bradycardia 01/04/2015    Past Medical History:  Diagnosis Date  . Allergy   . Heart murmur     Past Surgical History:  Procedure Laterality Date  . APPENDECTOMY    . CHOLECYSTECTOMY      Social History  Substance Use Topics  . Smoking status: Current Some Day Smoker  . Smokeless tobacco: Former NeurosurgeonUser  . Alcohol use 2.4 - 3.6 oz/week    4 - 6 Standard drinks or equivalent per week    Family History  Problem Relation Age of Onset  . Adopted: Yes  . Cancer Father   . Heart disease Father     No Known Allergies  Medication list has been reviewed and updated.  Current Outpatient Prescriptions on File Prior to Visit  Medication Sig Dispense Refill  . triamcinolone (NASACORT ALLERGY 24HR) 55 MCG/ACT AERO nasal inhaler Place 2 sprays into the nose daily. Reported on 07/09/2015     No current facility-administered medications on file prior to visit.     ROS ROS otherwise unremarkable unless listed above.   Physical Examination: BP 124/78 (BP Location: Right Arm, Cuff Size: Normal)   Pulse (!) 55   Temp 98.5 F (36.9 C) (Oral)   Resp 16    Wt 185 lb (83.9 kg)   SpO2 98%   BMI 27.72 kg/m  Ideal Body Weight:    Physical Exam  Constitutional: He is oriented to person, place, and time. He appears well-developed and well-nourished. No distress.  HENT:  Head: Normocephalic and atraumatic.  Eyes: Conjunctivae and EOM are normal. Pupils are equal, round, and reactive to light.  Cardiovascular: Normal rate.   Pulmonary/Chest: Effort normal. No respiratory distress.  Neurological: He is alert and oriented to person, place, and time.  Skin: Skin is warm and dry. He is not diaphoretic.  Psychiatric: He has a normal mood and affect. His behavior is normal.     Assessment and Plan: Alexander PattyWalter W Benitez is a 50 y.o. male who is here today for follow up of hepatic function --liver function recheck.  Continue terbinafine pending lab results.  1. Medication management - Hepatic Function Panel  2. Onychomycosis - terbinafine (LAMISIL) 250 MG tablet; Take 1 tablet (250 mg total) by mouth daily.  Dispense: 30 tablet; Refill: 0   Trena PlattStephanie English, PA-C [facility] 02/10/2016 9:44 AM

## 2016-06-07 IMAGING — CR DG CHEST 2V
2 series · 2 of 2 positions shown · non-contrast
Comparison: None.

CLINICAL DATA: Cough, congestion wheezing.

EXAM:
CHEST  2 VIEW

[PA]
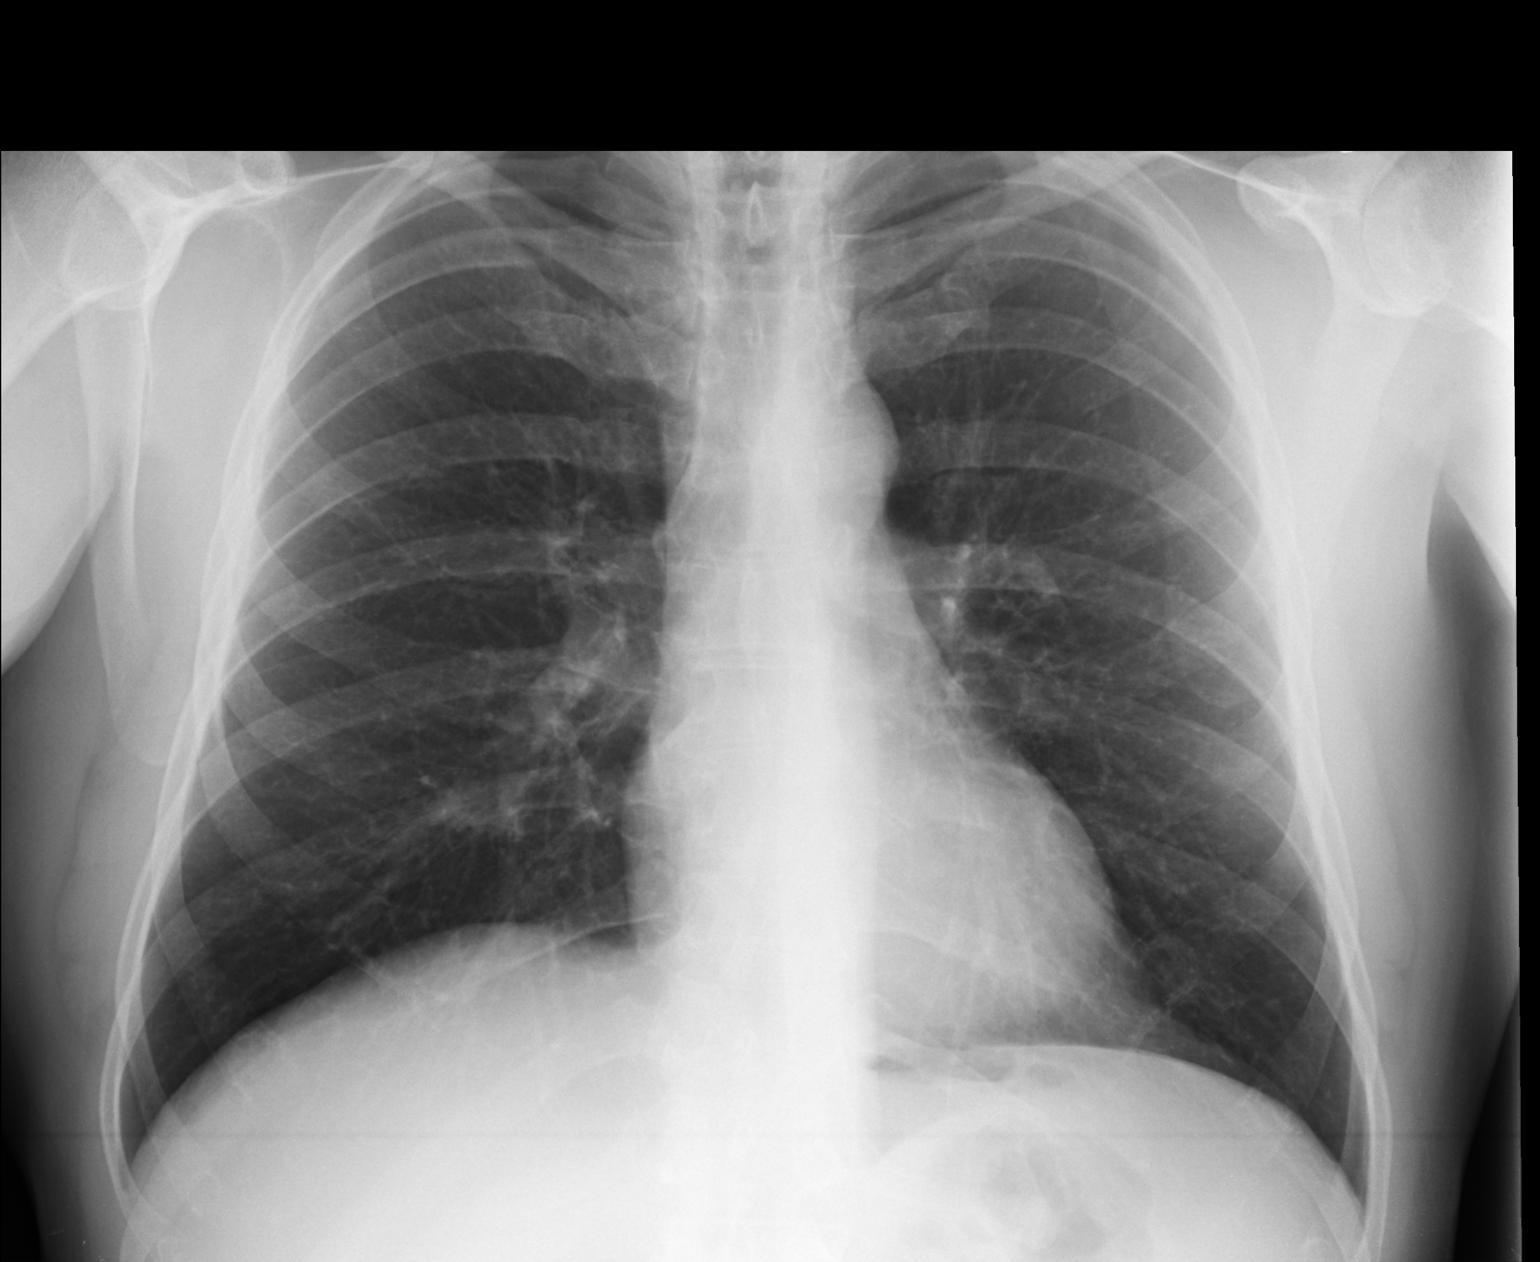

[lateral]
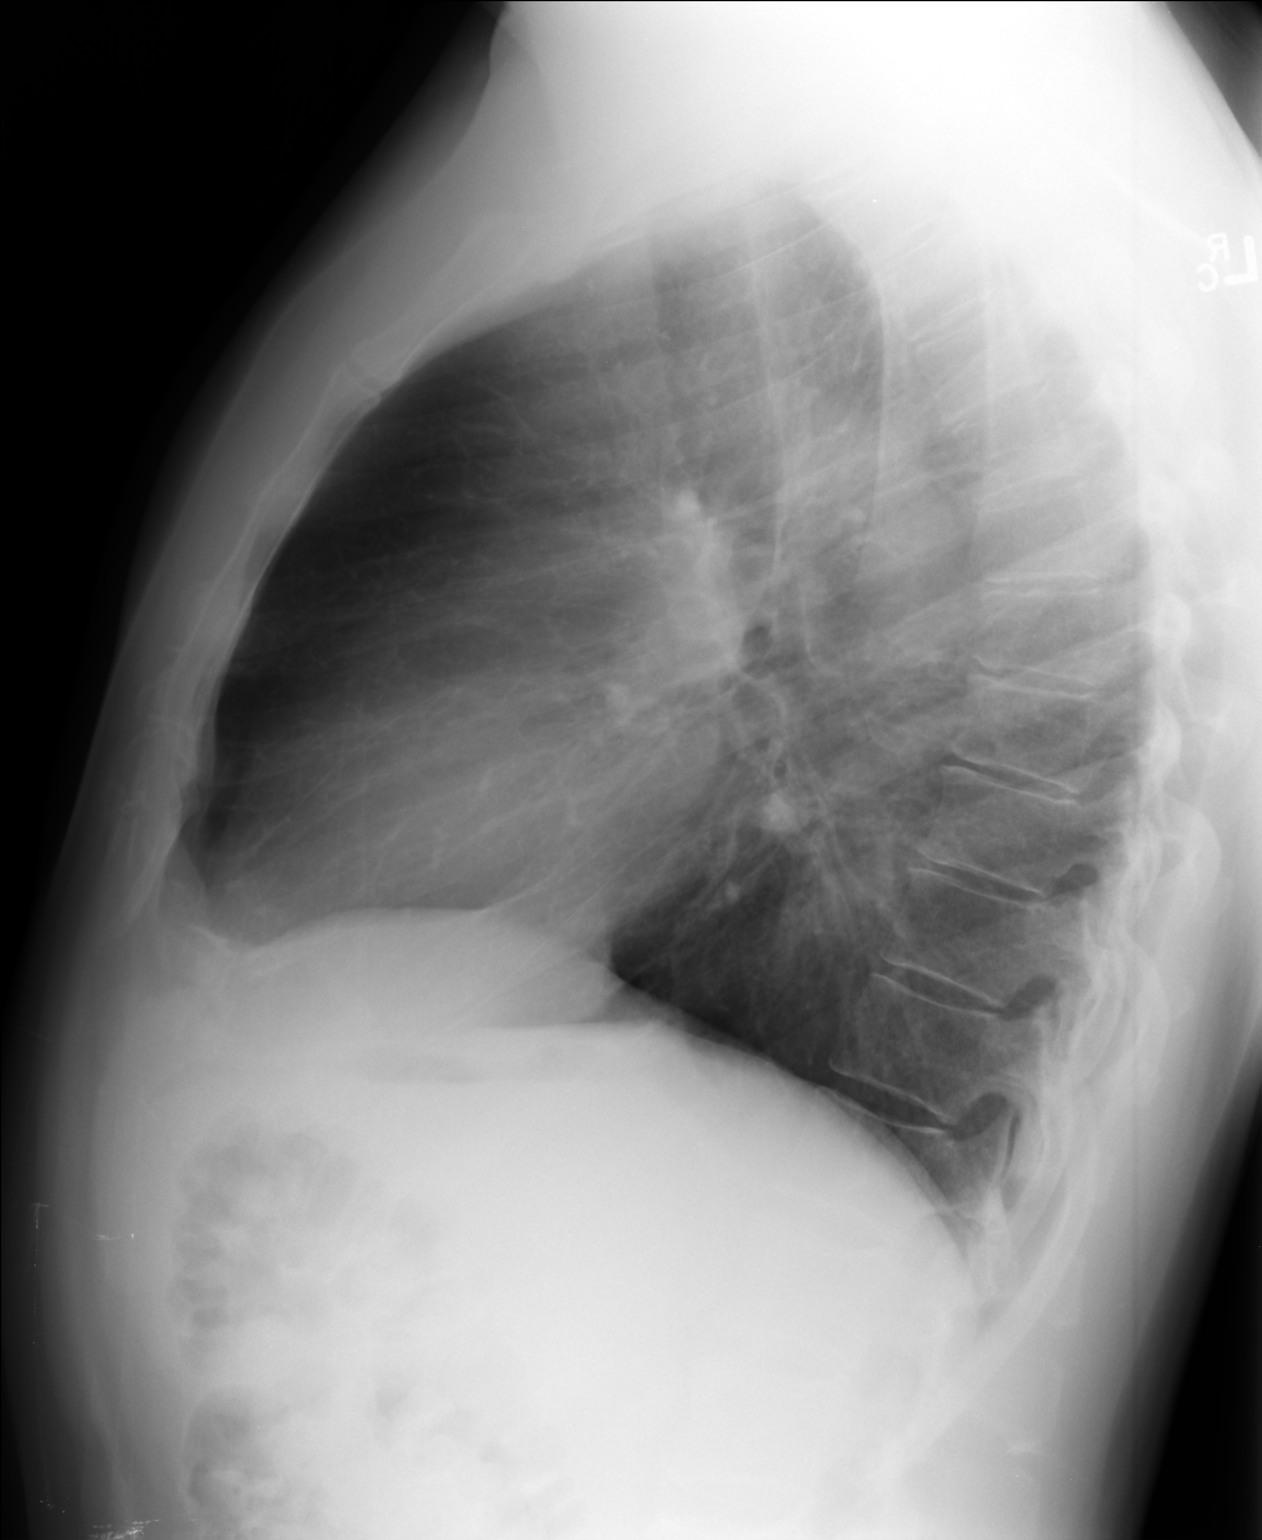

[2 of 2 positions shown; findings below may reference images not displayed]

FINDINGS: Heart size and mediastinal contours are within normal limits. Both
lungs are clear. Visualized skeletal structures are unremarkable.
IMPRESSION: Negative exam.

## 2016-09-20 IMAGING — CR DG CHEST 2V
2 series · 2 of 2 positions shown · non-contrast
Comparison: 09/20/2014

CLINICAL DATA: Chest pressure for 9 days. Nonexertional mid chest
pressure.

EXAM:
CHEST  2 VIEW

[PA]
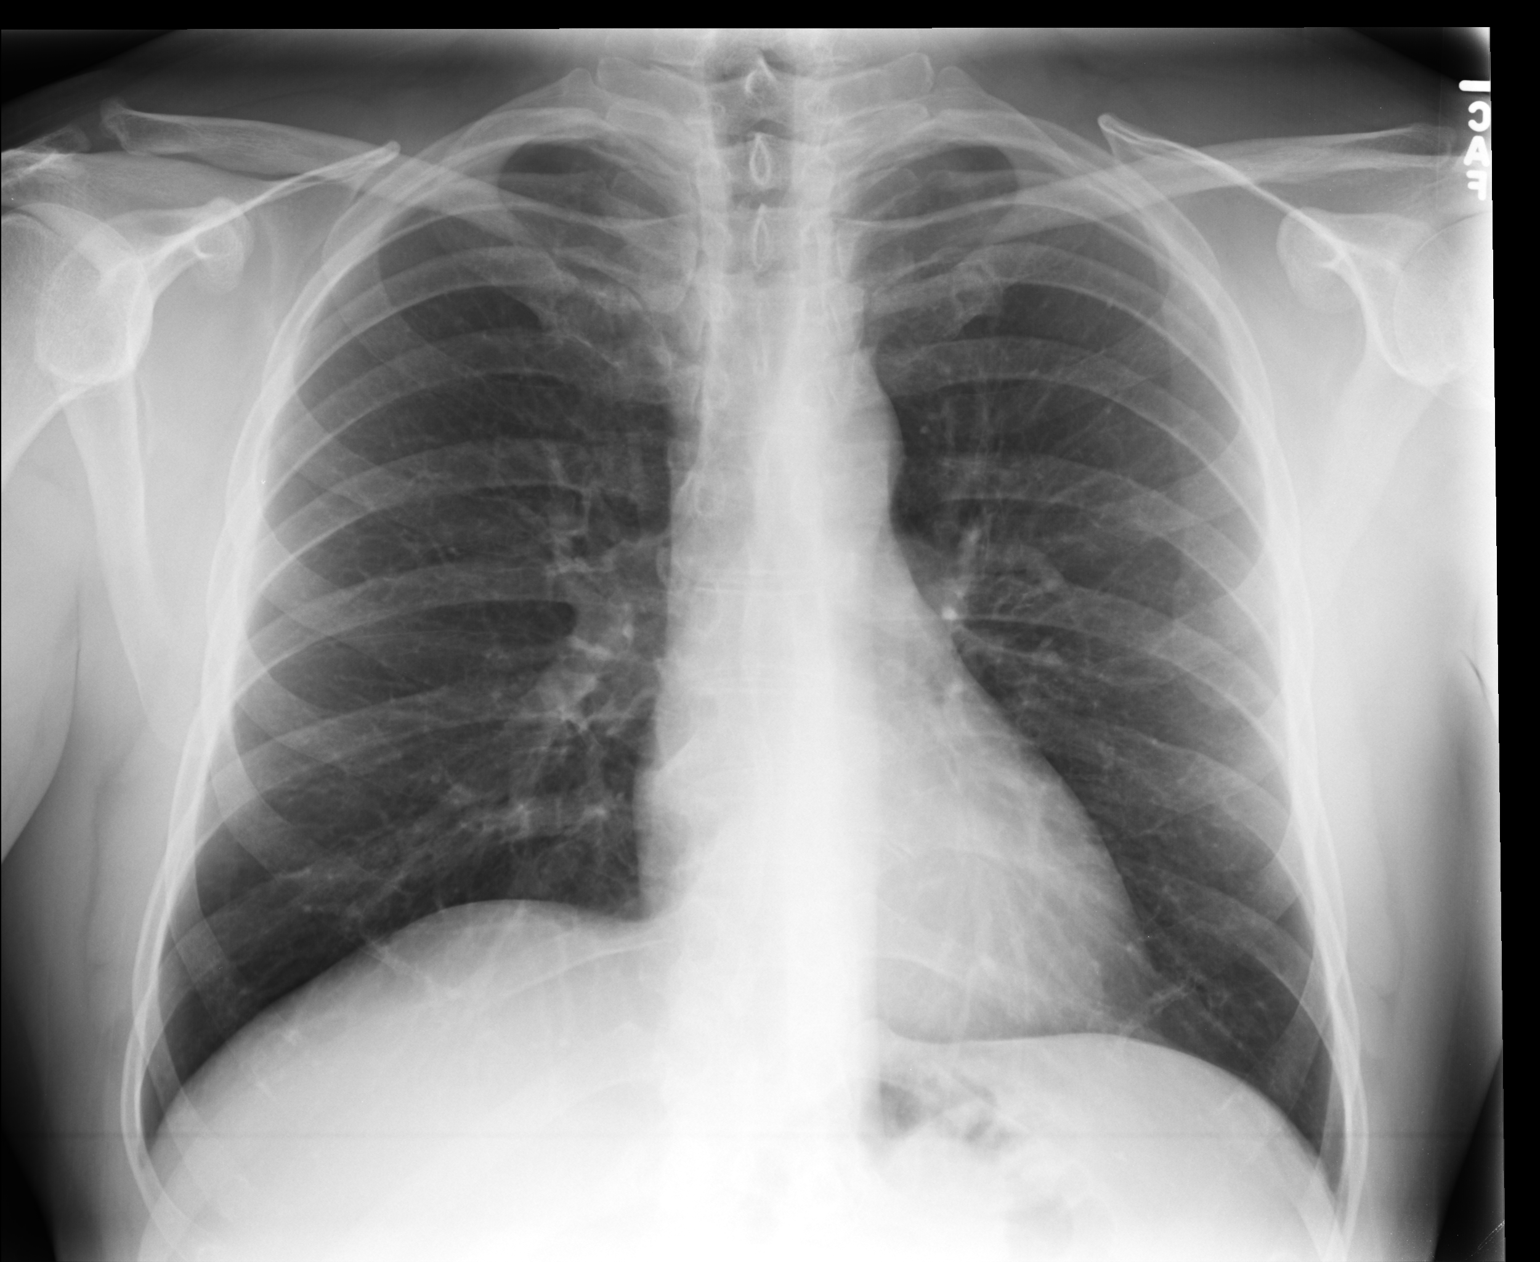

[lateral]
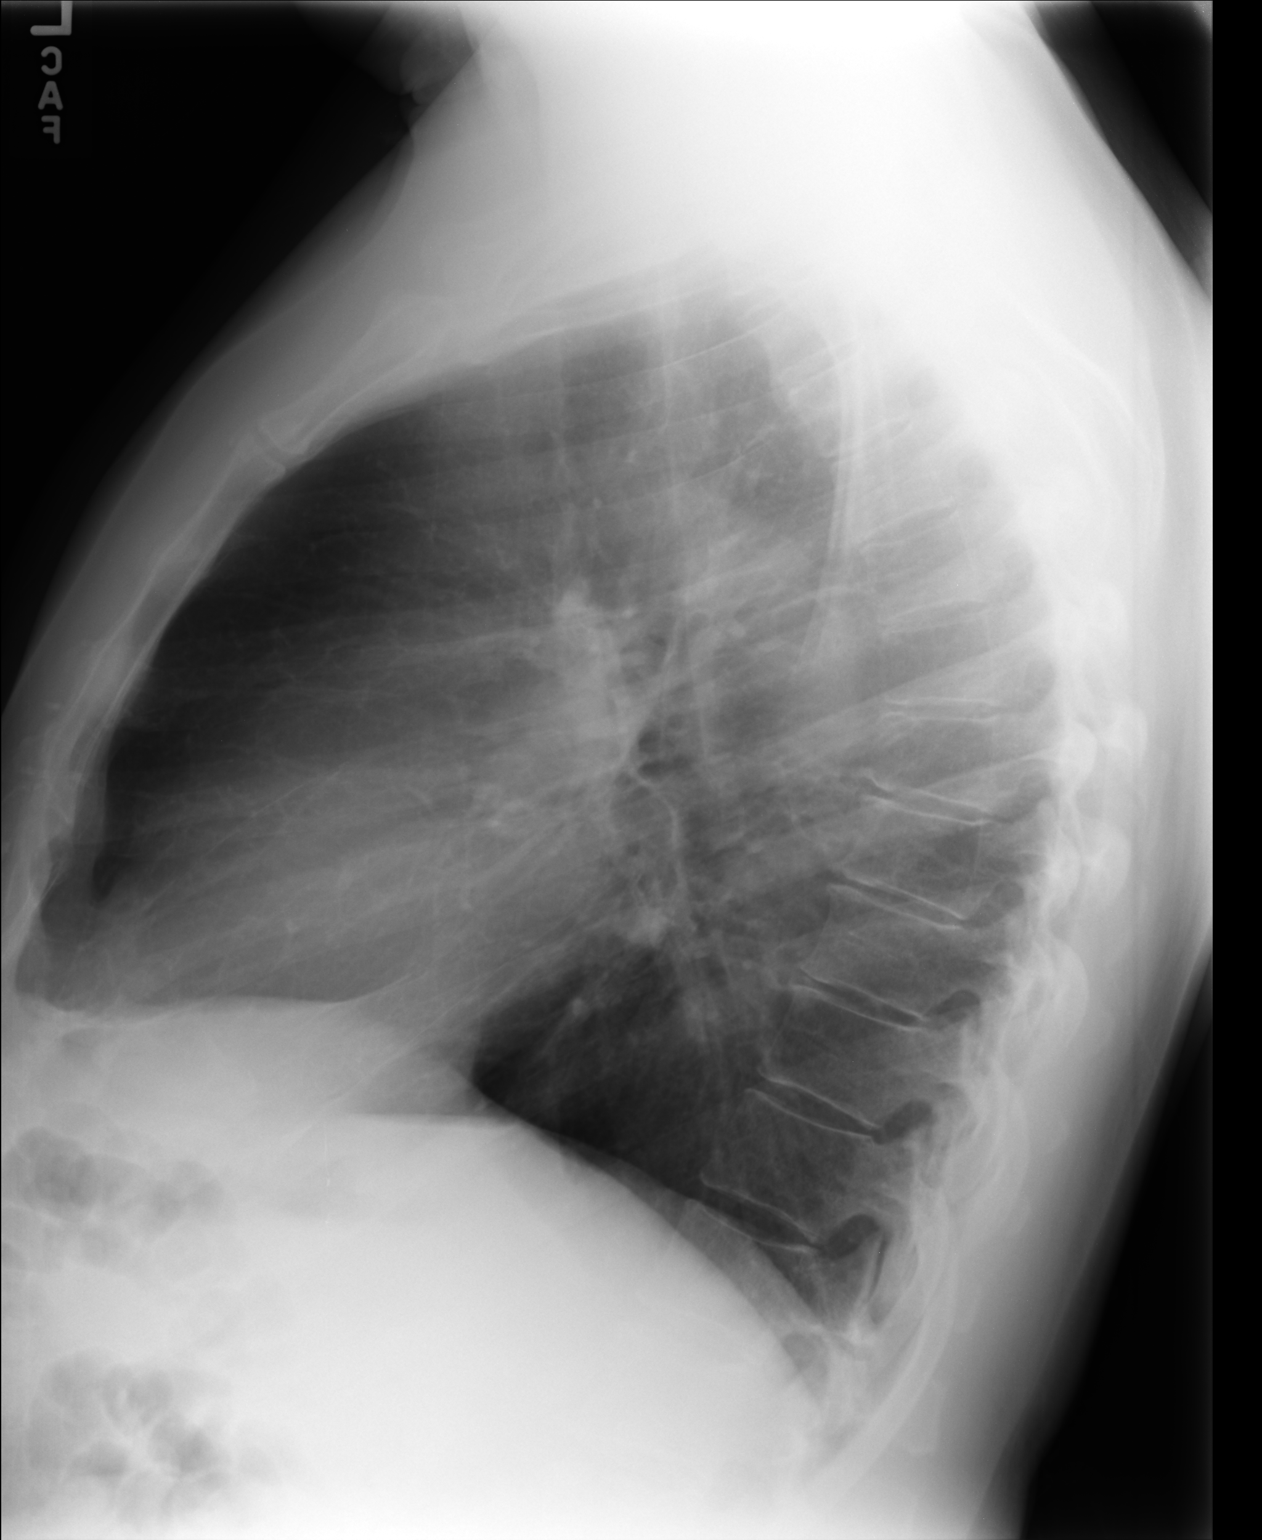

[2 of 2 positions shown; findings below may reference images not displayed]

FINDINGS: The cardiomediastinal contours are normal. The lungs are clear.
Pulmonary vasculature is normal. No consolidation, pleural effusion,
or pneumothorax. No acute osseous abnormalities are seen.
IMPRESSION: No acute pulmonary process.

## 2016-12-25 ENCOUNTER — Ambulatory Visit (INDEPENDENT_AMBULATORY_CARE_PROVIDER_SITE_OTHER): Payer: BLUE CROSS/BLUE SHIELD

## 2016-12-25 ENCOUNTER — Ambulatory Visit (INDEPENDENT_AMBULATORY_CARE_PROVIDER_SITE_OTHER): Payer: BLUE CROSS/BLUE SHIELD | Admitting: Physician Assistant

## 2016-12-25 ENCOUNTER — Telehealth: Payer: Self-pay | Admitting: Physician Assistant

## 2016-12-25 ENCOUNTER — Encounter: Payer: Self-pay | Admitting: Physician Assistant

## 2016-12-25 VITALS — BP 138/84 | HR 52 | Temp 98.0°F | Resp 16 | Ht 68.5 in | Wt 177.8 lb

## 2016-12-25 DIAGNOSIS — R05 Cough: Secondary | ICD-10-CM

## 2016-12-25 DIAGNOSIS — J22 Unspecified acute lower respiratory infection: Secondary | ICD-10-CM

## 2016-12-25 DIAGNOSIS — R058 Other specified cough: Secondary | ICD-10-CM

## 2016-12-25 DIAGNOSIS — K219 Gastro-esophageal reflux disease without esophagitis: Secondary | ICD-10-CM

## 2016-12-25 MED ORDER — AZITHROMYCIN 250 MG PO TABS: ORAL_TABLET | ORAL | 0 refills | Status: AC

## 2016-12-25 MED ORDER — BENZONATATE 100 MG PO CAPS: 100.0000 mg | ORAL_CAPSULE | Freq: Three times a day (TID) | ORAL | 0 refills | Status: DC | PRN

## 2016-12-25 MED ORDER — AZITHROMYCIN 250 MG PO TABS: ORAL_TABLET | ORAL | 0 refills | Status: DC

## 2016-12-25 MED ORDER — RANITIDINE HCL 150 MG PO TABS: 150.0000 mg | ORAL_TABLET | Freq: Two times a day (BID) | ORAL | 0 refills | Status: AC

## 2016-12-25 MED ORDER — OMEPRAZOLE 40 MG PO CPDR: 40.0000 mg | DELAYED_RELEASE_CAPSULE | Freq: Every day | ORAL | 3 refills | Status: DC

## 2016-12-25 MED ORDER — BENZONATATE 100 MG PO CAPS: 100.0000 mg | ORAL_CAPSULE | Freq: Three times a day (TID) | ORAL | 0 refills | Status: AC | PRN

## 2016-12-25 MED ORDER — OMEPRAZOLE 40 MG PO CPDR: 40.0000 mg | DELAYED_RELEASE_CAPSULE | Freq: Every day | ORAL | 3 refills | Status: AC

## 2016-12-25 MED ORDER — RANITIDINE HCL 150 MG PO TABS: 150.0000 mg | ORAL_TABLET | Freq: Two times a day (BID) | ORAL | 0 refills | Status: DC

## 2016-12-25 NOTE — Telephone Encounter (Signed)
Pt is calling stating that the prescriptions that were called in today were called into the wrong pharmacy. Pt states medications need to be called in to the [facility] on [address].  Please resend to correct pharmacy

## 2016-12-25 NOTE — Patient Instructions (Addendum)
Please make sure you are hydrating well with water.  I would like you to continue mucinex.  You can take the mucinex maximum strength  every 12 hours.    Viral Respiratory Infection A viral respiratory infection is an illness that affects parts of the body used for breathing, like the lungs, nose, and throat. It is caused by a germ called a virus. Some examples of this kind of infection are:  A cold.  The flu (influenza).  A respiratory syncytial virus (RSV) infection.  How do I know if I have this infection? Most of the time this infection causes:  A stuffy or runny nose.  Yellow or green fluid in the nose.  A cough.  Sneezing.  Tiredness (fatigue).  Achy muscles.  A sore throat.  Sweating or chills.  A fever.  A headache.  How is this infection treated? If the flu is diagnosed early, it may be treated with an antiviral medicine. This medicine shortens the length of time a person has symptoms. Symptoms may be treated with over-the-counter and prescription medicines, such as:  Expectorants. These make it easier to cough up mucus.  Decongestant nasal sprays.  Doctors do not prescribe antibiotic medicines for viral infections. They do not work with this kind of infection. How do I know if I should stay home? To keep others from getting sick, stay home if you have:  A fever.  A lasting cough.  A sore throat.  A runny nose.  Sneezing.  Muscles aches.  Headaches.  Tiredness.  Weakness.  Chills.  Sweating.  An upset stomach (nausea).  Follow these instructions at home:  Rest as much as possible.  Take over-the-counter and prescription medicines only as told by your doctor.  Drink enough fluid to keep your pee (urine) clear or pale yellow.  Gargle with salt water. Do this 3-4 times per day or as needed. To make a salt-water mixture, dissolve -1 tsp of salt in 1 cup of warm water. Make sure the salt dissolves all the way.  Use nose drops  made from salt water. This helps with stuffiness (congestion). It also helps soften the skin around your nose.  Do not drink alcohol.  Do not use tobacco products, including cigarettes, chewing tobacco, and e-cigarettes. If you need help quitting, ask your doctor. Get help if:  Your symptoms last for 10 days or longer.  Your symptoms get worse over time.  You have a fever.  You have very bad pain in your face or forehead.  Parts of your jaw or neck become very swollen. Get help right away if:  You feel pain or pressure in your chest.  You have shortness of breath.  You faint or feel like you will faint.  You keep throwing up (vomiting).  You feel confused. This information is not intended to replace advice given to you by your health care provider. Make sure you discuss any questions you have with your health care provider. Document Released: 03/08/2008 Document Revised: 09/01/2015 Document Reviewed: 09/01/2014 Elsevier Interactive Patient Education  2018 [employer].    IF you received an x-ray today, you will receive an invoice from [facility]. Please contact [facility] at [phone] with questions or concerns regarding your invoice.   IF you received labwork today, you will receive an invoice from [facility]. Please contact [facility] at [phone] with questions or concerns regarding your invoice.   Our billing staff will not be able to assist you with questions regarding bills from  these companies.  You will be contacted with the lab results as soon as they are available. The fastest way to get your results is to activate your My Chart account. Instructions are located on the last page of this paperwork. If you have not heard from [place] regarding the results in 2 weeks, please contact this office.

## 2016-12-25 NOTE — Telephone Encounter (Signed)
Rx resent to [place] [facility]. Canceled rx at [facility].

## 2016-12-25 NOTE — Progress Notes (Signed)
PRIMARY CARE AT [facility] [address] [place] [address] [phone]  Date:  12/25/2016   Name:  Alexander Benitez   DOB:  June 19, 1965   MRN:  [id number]  PCP:  Lenell Antu, DO    History of Present Illness:  Alexander Benitez is a 51 y.o. male patient who presents to PCP with  Chief Complaint  Patient presents with  . Cough    x 3 weeks, mucus each morning, greenish in color. Patient reports swelling to glands, irritation to throat     ssymptoms initiated with facial pain sinus congestion.  He thought it improved 3 days after symptoms, but now he feels the pain has worsened. Jaws feel swollen and pain.  He has a sharp pain at the esophagus.  Coughing green sputum.  Last night, he felt likehe could not cathch his breath when he layed back.  Felt better with sitting up.  He has not had any fever.  Throat pain.     Patient Active Problem List   Diagnosis Date Noted  . Mitral valve regurgitation 01/04/2015  . Mitral valve prolapse 01/04/2015  . Sinus bradycardia 01/04/2015    Past Medical History:  Diagnosis Date  . Allergy   . Heart murmur     Past Surgical History:  Procedure Laterality Date  . APPENDECTOMY    . CHOLECYSTECTOMY      Social History  Substance Use Topics  . Smoking status: Current Some Day Smoker  . Smokeless tobacco: Former Neurosurgeon  . Alcohol use 2.4 - 3.6 oz/week    4 - 6 Standard drinks or equivalent per week    Family History  Problem Relation Age of Onset  . Adopted: Yes  . Cancer Father   . Heart disease Father     No Known Allergies  Medication list has been reviewed and updated.  Current Outpatient Prescriptions on File Prior to Visit  Medication Sig Dispense Refill  . triamcinolone (NASACORT ALLERGY 24HR) 55 MCG/ACT AERO nasal inhaler Place 2 sprays into the nose daily. Reported on 07/09/2015    . terbinafine (LAMISIL) 250 MG tablet Take 1 tablet (250 mg total) by mouth daily. (Patient not taking: Reported on 12/25/2016) 30 tablet 0   No  current facility-administered medications on file prior to visit.     ROS ROS otherwise unremarkable unless listed above.  Physical Examination: BP (!) 148/89 (BP Location: Right Arm, Patient Position: Sitting, Cuff Size: Large)   Pulse (!) 52   Temp 98 F (36.7 C) (Oral)   Resp 16   Ht 5' 8.5" (1.74 m)   Wt 177 lb 12.8 oz (80.6 kg)   SpO2 96%   BMI 26.64 kg/m  Ideal Body Weight: Weight in (lb) to have BMI = 25: 166.5  Physical Exam  Constitutional: He is oriented to person, place, and time. He appears well-developed and well-nourished. No distress.  HENT:  Head: Normocephalic and atraumatic.  Right Ear: Tympanic membrane, external ear and ear canal normal.  Left Ear: Tympanic membrane, external ear and ear canal normal.  Nose: Mucosal edema and rhinorrhea present. Right sinus exhibits no maxillary sinus tenderness and no frontal sinus tenderness. Left sinus exhibits no maxillary sinus tenderness and no frontal sinus tenderness.  Mouth/Throat: No uvula swelling. No oropharyngeal exudate, posterior oropharyngeal edema or posterior oropharyngeal erythema.  Eyes: Pupils are equal, round, and reactive to light. Conjunctivae, EOM and lids are normal. Right eye exhibits normal extraocular motion. Left eye exhibits normal extraocular motion.  Neck: Trachea normal and full passive range of motion without pain. No edema and no erythema present.  Cardiovascular: Normal rate and normal heart sounds.  Exam reveals no gallop and no friction rub.   No murmur heard. Pulmonary/Chest: Effort normal. No apnea. No respiratory distress. He has no decreased breath sounds. He has no wheezes. He has no rhonchi.  Neurological: He is alert and oriented to person, place, and time.  Skin: Skin is warm and dry. He is not diaphoretic.  Psychiatric: He has a normal mood and affect. His behavior is normal.    Dg Chest 2 View  Result Date: 12/25/2016 CLINICAL DATA:  Cough, chest pain EXAM: CHEST  2 VIEW  COMPARISON:  01/03/2015 FINDINGS: The heart size and mediastinal contours are within normal limits. Both lungs are clear. The visualized skeletal structures are unremarkable. IMPRESSION: No active cardiopulmonary disease. Electronically Signed   By: Judie Petit.  Shick M.D.   On: 12/25/2016 15:58    Dg Chest 2 View  Result Date: 12/25/2016 CLINICAL DATA:  Cough, chest pain EXAM: CHEST  2 VIEW COMPARISON:  01/03/2015 FINDINGS: The heart size and mediastinal contours are within normal limits. Both lungs are clear. The visualized skeletal structures are unremarkable. IMPRESSION: No active cardiopulmonary disease. Electronically Signed   By: Judie Petit.  Shick M.D.   On: 12/25/2016 15:58     Assessment and Plan: DONNELL WION is a 51 y.o. male who is here today for cc of  Chief Complaint  Patient presents with  . Cough    x 3 weeks, mucus each morning, greenish in color. Patient reports swelling to glands, irritation to throat  will treat for bacterial etiology rtc as needed. Lower respiratory infection (e.g., bronchitis, pneumonia, pneumonitis, pulmonitis) - Plan: DISCONTINUED: azithromycin (ZITHROMAX) 250 MG tablet, DISCONTINUED: benzonatate (TESSALON) 100 MG capsule  Productive cough - Plan: DG Chest 2 View  Gastroesophageal reflux disease, esophagitis presence not specified - Plan: DISCONTINUED: omeprazole (PRILOSEC) 40 MG capsule  Trena Platt, PA-C [facility] 9/23/20183:44 PM

## 2017-07-08 ENCOUNTER — Encounter: Payer: Self-pay | Admitting: Physician Assistant

## 2017-11-09 ENCOUNTER — Encounter: Payer: Self-pay | Admitting: Family Medicine

## 2017-11-09 ENCOUNTER — Ambulatory Visit (INDEPENDENT_AMBULATORY_CARE_PROVIDER_SITE_OTHER): Payer: BLUE CROSS/BLUE SHIELD | Admitting: Family Medicine

## 2017-11-09 ENCOUNTER — Other Ambulatory Visit: Payer: Self-pay

## 2017-11-09 VITALS — BP 125/81 | HR 58 | Temp 98.2°F | Resp 16 | Ht 68.5 in | Wt 166.2 lb

## 2017-11-09 DIAGNOSIS — G4700 Insomnia, unspecified: Secondary | ICD-10-CM | POA: Diagnosis not present

## 2017-11-09 MED ORDER — ZOLPIDEM TARTRATE 5 MG PO TABS: ORAL_TABLET | ORAL | 1 refills | Status: AC

## 2017-11-09 MED ORDER — TRAZODONE HCL 50 MG PO TABS: ORAL_TABLET | ORAL | 3 refills | Status: AC

## 2017-11-09 NOTE — Patient Instructions (Addendum)
Avoid caffeine in the latter half of the day  Minimize alcohol intake because it can disturb sleep patterns  Work on decreasing the nicotine use  Get regular exercise  Avoid screen time within an hour of bedtime  Take trazodone 50 mg 1/2 pill every night for the first 2 or 3 nights, then 1 pill at bedtime.  If after a week on that you do not feel like you are doing better go ahead and increase to 1-1/2 pills at bedtime.  Take the trazodone about 1 hour before designated bedtime.  You have gone several nights without a decent night sleep you can take Ambien 5 mg in addition to the trazodone.  Take the Ambien about 15 to 30 minutes before bedtime.  If getting more depressed to seek additional attention.  Return as needed    IF you received an x-ray today, you will receive an invoice from [facility]. Please contact [facility] at [phone] with questions or concerns regarding your invoice.   IF you received labwork today, you will receive an invoice from [facility]. Please contact [facility] at [phone] with questions or concerns regarding your invoice.   Our billing staff will not be able to assist you with questions regarding bills from these companies.  You will be contacted with the lab results as soon as they are available. The fastest way to get your results is to activate your My Chart account. Instructions are located on the last page of this paperwork. If you have not heard from us regarding the results in 2 weeks, please contact this office.

## 2017-11-09 NOTE — Progress Notes (Signed)
Patient ID: Alexander PattyWalter W Benitez, male    DOB: 03/06/1966  Age: 52 y.o. MRN: [id number]  Chief Complaint  Patient presents with  . Insomnia    x 1 year     Subjective:   52 year old male with a problem of insomnia.  He has a history of some positive causative factors.  He has been with his wife for about 20 years, and she was from [place].  It became apparent after many years of trying to get her official [place] papers that she would not be able to get them.  A year ago they decided to move to [place] and she is now living there while he is living here and continuing to work on plans to move down there and do some investments.  He periodically has seen her, but not very often.  He mostly talks on the phone several times a day.  He does have a history of some snoring, no sleep apnea reported.  He does wear the nasal strips at night.  He smokes about a half a pack of cigarettes, which is gone up over the last year since his wife is been away.  He also drinks 3 or 4 drinks several nights a week, not on a daily basis.  He does work a job with [employer].  He also gets exercise caring for his 3 acres.  They are busy trying to get his place on the market and with that has a lot on his plate.  Physically he is healthy  Current allergies, medications, problem list, past/family and social histories reviewed.  Objective:  BP 125/81   Pulse (!) 58   Temp 98.2 F (36.8 C)   Resp 16   Ht 5' 8.5" (1.74 m)   Wt 166 lb 3.2 oz (75.4 kg)   SpO2 98%   BMI 24.90 kg/m   No carotid bruits.  No thyromegaly.  Chest clear.  Heart regular.  Assessment & Plan:   Assessment: 1. Insomnia, unspecified type       Plan: We will try trazodone along with some as needed Ambien not for regular use.  See instructions.  No orders of the defined types were placed in this encounter.   Meds ordered this encounter  Medications  . traZODone (DESYREL) 50 MG tablet    Sig: Take 1/2 to 1-1/2 pills (25  to 5 mg) at bedtime as directed.    Dispense:  30 tablet    Refill:  3  . zolpidem (AMBIEN) 5 MG tablet    Sig: Take 1 pill before bedtime if you have been several nights without a good night sleep.    Dispense:  15 tablet    Refill:  1         Patient Instructions   Avoid caffeine in the latter half of the day  Minimize alcohol intake because it can disturb sleep patterns  Work on decreasing the nicotine use  Get regular exercise  Avoid screen time within an hour of bedtime  Take trazodone 50 mg 1/2 pill every night for the first 2 or 3 nights, then 1 pill at bedtime.  If after a week on that you do not feel like you are doing better go ahead and increase to 1-1/2 pills at bedtime.  Take the trazodone about 1 hour before designated bedtime.  You have gone several nights without a decent night sleep you can take Ambien 5 mg in addition to the trazodone.  Take the  Ambien about 15 to 30 minutes before bedtime.  If getting more depressed to seek additional attention.  Return as needed    IF you received an x-ray today, you will receive an invoice from [facility]. Please contact [facility] at [phone] with questions or concerns regarding your invoice.   IF you received labwork today, you will receive an invoice from [facility]. Please contact [facility] at [phone] with questions or concerns regarding your invoice.   Our billing staff will not be able to assist you with questions regarding bills from these companies.  You will be contacted with the lab results as soon as they are available. The fastest way to get your results is to activate your My Chart account. Instructions are located on the last page of this paperwork. If you have not heard from [place] regarding the results in 2 weeks, please contact this office.         Return if symptoms worsen or fail to improve.   Janace Hoard, MD 11/09/2017

## 2017-12-03 ENCOUNTER — Encounter: Payer: Self-pay | Admitting: Emergency Medicine

## 2017-12-03 ENCOUNTER — Ambulatory Visit (INDEPENDENT_AMBULATORY_CARE_PROVIDER_SITE_OTHER): Payer: BLUE CROSS/BLUE SHIELD | Admitting: Emergency Medicine

## 2017-12-03 ENCOUNTER — Other Ambulatory Visit: Payer: Self-pay

## 2017-12-03 VITALS — BP 146/86 | HR 55 | Temp 98.2°F | Resp 16 | Ht 67.0 in | Wt 164.2 lb

## 2017-12-03 DIAGNOSIS — L03113 Cellulitis of right upper limb: Secondary | ICD-10-CM | POA: Diagnosis not present

## 2017-12-03 DIAGNOSIS — Z23 Encounter for immunization: Secondary | ICD-10-CM | POA: Diagnosis not present

## 2017-12-03 DIAGNOSIS — Z1211 Encounter for screening for malignant neoplasm of colon: Secondary | ICD-10-CM

## 2017-12-03 MED ORDER — CEFADROXIL 500 MG PO CAPS
500.0000 mg | ORAL_CAPSULE | Freq: Two times a day (BID) | ORAL | 0 refills | Status: AC
Start: 2017-12-03 — End: 2017-12-10

## 2017-12-03 NOTE — Progress Notes (Signed)
Alexander Benitez 52 y.o.   Chief Complaint  Patient presents with  . Wound Infection    tatoo right arm x 10 days with pus coming out of wound    HISTORY OF PRESENT ILLNESS: This is a 52 y.o. male complaining of possible infection to recent tattoo on the right arm which was finished 10 days ago.  Noticed pus coming out of several places.  However states that today is better couple days ago.  Denies fever or chills.  No other associated symptoms.  Has been doing local wound care at home.  HPI   Prior to Admission medications   Medication Sig Start Date End Date Taking? Authorizing Provider  ranitidine (ZANTAC) 150 MG tablet Take 1 tablet (150 mg total) by mouth 2 (two) times daily. 12/25/16  Yes English, Judeth CornfieldStephanie D, PA  terbinafine (LAMISIL) 250 MG tablet Take 1 tablet (250 mg total) by mouth daily. 02/08/16  Yes English, Judeth CornfieldStephanie D, PA  triamcinolone (NASACORT ALLERGY 24HR) 55 MCG/ACT AERO nasal inhaler Place 2 sprays into the nose daily. Reported on 07/09/2015   Yes [provider]  zolpidem (AMBIEN) 5 MG tablet Take 1 pill before bedtime if you have been several nights without a good night sleep. 11/09/17  Yes Peyton NajjarHopper, David H, MD  azithromycin (ZITHROMAX) 250 MG tablet Take 2 tabs PO x 1 dose, then 1 tab PO QD x 4 days Patient not taking: Reported on 11/09/2017 12/25/16   Trena PlattEnglish, Stephanie D, PA  benzonatate (TESSALON) 100 MG capsule Take 1-2 capsules (100-200 mg total) by mouth 3 (three) times daily as needed for cough. Patient not taking: Reported on 11/09/2017 12/25/16   Trena PlattEnglish, Stephanie D, PA  cefadroxil (DURICEF) 500 MG capsule Take 1 capsule (500 mg total) by mouth 2 (two) times daily for 7 days. 12/03/17 12/10/17  Georgina QuintSagardia, Sian Joles Jose, MD  omeprazole (PRILOSEC) 40 MG capsule Take 1 capsule (40 mg total) by mouth daily. Patient not taking: Reported on 11/09/2017 12/25/16   Trena PlattEnglish, Stephanie D, PA  traZODone (DESYREL) 50 MG tablet Take 1/2 to 1-1/2 pills (25 to 5 mg) at bedtime as  directed. Patient not taking: Reported on 12/03/2017 11/09/17   Peyton NajjarHopper, David H, MD    No Known Allergies  Patient Active Problem List   Diagnosis Date Noted  . Mitral valve regurgitation 01/04/2015  . Mitral valve prolapse 01/04/2015  . Sinus bradycardia 01/04/2015    Past Medical History:  Diagnosis Date  . Allergy   . Heart murmur     Past Surgical History:  Procedure Laterality Date  . APPENDECTOMY    . CHOLECYSTECTOMY      Social History   Socioeconomic History  . Marital status: Married    Spouse name: Not on file  . Number of children: Not on file  . Years of education: Not on file  . Highest education level: Not on file  Occupational History  . Not on file  Social Needs  . Financial resource strain: Not on file  . Food insecurity:    Worry: Not on file    Inability: Not on file  . Transportation needs:    Medical: Not on file    Non-medical: Not on file  Tobacco Use  . Smoking status: Current Some Day Smoker  . Smokeless tobacco: Former Engineer, [employer]  Substance and Sexual Activity  . Alcohol use: Yes    Alcohol/week: 4.0 - 6.0 standard drinks    Types: 4 - 6 Standard drinks or equivalent per week  .  Drug use: No  . Sexual activity: Never  Lifestyle  . Physical activity:    Days per week: Not on file    Minutes per session: Not on file  . Stress: Not on file  Relationships  . Social connections:    Talks on phone: Not on file    Gets together: Not on file    Attends religious service: Not on file    Active member of club or organization: Not on file    Attends meetings of clubs or organizations: Not on file    Relationship status: Not on file  . Intimate partner violence:    Fear of current or ex partner: Not on file    Emotionally abused: Not on file    Physically abused: Not on file    Forced sexual activity: Not on file  Other Topics Concern  . Not on file  Social History Narrative  . Not on file    Family History  Adopted: Yes  Problem  Relation Age of Onset  . Cancer Father   . Heart disease Father      ROS   Physical Exam  Constitutional: He is oriented to person, place, and time. He appears well-developed and well-nourished.  HENT:  Head: Normocephalic and atraumatic.  Eyes: Pupils are equal, round, and reactive to light.  Neck: Normal range of motion. Neck supple.  Cardiovascular: Normal rate and regular rhythm.  Pulmonary/Chest: Effort normal and breath sounds normal.  Musculoskeletal: Normal range of motion.  Neurological: He is alert and oriented to person, place, and time. No sensory deficit. He exhibits normal muscle tone.  Skin: Skin is warm and dry. Capillary refill takes less than 2 seconds.  Right arm: Recent extensive tattoo covering entire arm with several areas of erythema and crusted lesions.  Slightly tender.  No significant swelling.  No crepitation.  Psychiatric: He has a normal mood and affect. His behavior is normal.  Vitals reviewed.  A total of 25 minutes was spent in the room with the patient, greater than 50% of which was in counseling/coordination of care regarding Differential diagnosis, management, treatment, medication, and need for follow-up. .   ASSESSMENT & PLAN: Tell was seen today for wound infection.  Diagnoses and all orders for this visit:  Cellulitis of right upper extremity -     cefadroxil (DURICEF) 500 MG capsule; Take 1 capsule (500 mg total) by mouth 2 (two) times daily for 7 days.  Colon cancer screening -     Ambulatory referral to Gastroenterology  Need for diphtheria-tetanus-pertussis (Tdap) vaccine -     Tdap vaccine greater than or equal to 7yo IM    Patient Instructions       If you have lab work done today you will be contacted with your lab results within the next 2 weeks.  If you have not heard from [place] then please contact us. The fastest way to get your results is to register for My Chart.   IF you received an x-ray today, you will receive  an invoice from [facility]. Please contact [facility] at [phone] with questions or concerns regarding your invoice.   IF you received labwork today, you will receive an invoice from [facility]. Please contact [facility] at [phone] with questions or concerns regarding your invoice.   Our billing staff will not be able to assist you with questions regarding bills from these companies.  You will be contacted with the lab results as soon as they are available.  The fastest way to get your results is to activate your My Chart account. Instructions are located on the last page of this paperwork. If you have not heard from [place] regarding the results in 2 weeks, please contact this office.     Cellulitis, Adult Cellulitis is a skin infection. The infected area is usually red and sore. This condition occurs most often in the arms and lower legs. It is very important to get treated for this condition. Follow these instructions at home:  Take over-the-counter and prescription medicines only as told by your doctor.  If you were prescribed an antibiotic medicine, take it as told by your doctor. Do not stop taking the antibiotic even if you start to feel better.  Drink enough fluid to keep your pee (urine) clear or pale yellow.  Do not touch or rub the infected area.  Raise (elevate) the infected area above the level of your heart while you are sitting or lying down.  Place warm or cold wet cloths (warm or cold compresses) on the infected area. Do this as told by your doctor.  Keep all follow-up visits as told by your doctor. This is important. These visits let your doctor make sure your infection is not getting worse. Contact a doctor if:  You have a fever.  Your symptoms do not get better after 1-2 days of treatment.  Your bone or joint under the infected area starts to hurt after the skin has healed.  Your infection comes back. This can happen in the same area or  another area.  You have a swollen bump in the infected area.  You have new symptoms.  You feel ill and also have muscle aches and pains. Get help right away if:  Your symptoms get worse.  You feel very sleepy.  You throw up (vomit) or have watery poop (diarrhea) for a long time.  There are red streaks coming from the infected area.  Your red area gets larger.  Your red area turns darker. This information is not intended to replace advice given to you by your health care provider. Make sure you discuss any questions you have with your health care provider. Document Released: 09/12/2007 Document Revised: 09/01/2015 Document Reviewed: 02/02/2015 Elsevier Interactive Patient Education  2018 Elsevier Inc.      Edwina Barth, MD [facility]

## 2017-12-03 NOTE — Patient Instructions (Addendum)
     If you have lab work done today you will be contacted with your lab results within the next 2 weeks.  If you have not heard from us then please contact us. The fastest way to get your results is to register for My Chart.   IF you received an x-ray today, you will receive an invoice from [facility]. Please contact [facility] at [phone] with questions or concerns regarding your invoice.   IF you received labwork today, you will receive an invoice from [facility]. Please contact [facility] at [phone] with questions or concerns regarding your invoice.   Our billing staff will not be able to assist you with questions regarding bills from these companies.  You will be contacted with the lab results as soon as they are available. The fastest way to get your results is to activate your My Chart account. Instructions are located on the last page of this paperwork. If you have not heard from us regarding the results in 2 weeks, please contact this office.       Cellulitis, Adult Cellulitis is a skin infection. The infected area is usually red and sore. This condition occurs most often in the arms and lower legs. It is very important to get treated for this condition. Follow these instructions at home:  Take over-the-counter and prescription medicines only as told by your doctor.  If you were prescribed an antibiotic medicine, take it as told by your doctor. Do not stop taking the antibiotic even if you start to feel better.  Drink enough fluid to keep your pee (urine) clear or pale yellow.  Do not touch or rub the infected area.  Raise (elevate) the infected area above the level of your heart while you are sitting or lying down.  Place warm or cold wet cloths (warm or cold compresses) on the infected area. Do this as told by your doctor.  Keep all follow-up visits as told by your doctor. This is important. These visits let your doctor make sure your infection is  not getting worse. Contact a doctor if:  You have a fever.  Your symptoms do not get better after 1-2 days of treatment.  Your bone or joint under the infected area starts to hurt after the skin has healed.  Your infection comes back. This can happen in the same area or another area.  You have a swollen bump in the infected area.  You have new symptoms.  You feel ill and also have muscle aches and pains. Get help right away if:  Your symptoms get worse.  You feel very sleepy.  You throw up (vomit) or have watery poop (diarrhea) for a long time.  There are red streaks coming from the infected area.  Your red area gets larger.  Your red area turns darker. This information is not intended to replace advice given to you by your health care provider. Make sure you discuss any questions you have with your health care provider. Document Released: 09/12/2007 Document Revised: 09/01/2015 Document Reviewed: 02/02/2015 Elsevier Interactive Patient Education  2018 Elsevier Inc.  

## 2018-09-12 IMAGING — DX DG CHEST 2V
2 series · 2 of 2 positions shown · non-contrast
Comparison: 01/03/2015

CLINICAL DATA: Cough, chest pain

EXAM:
CHEST  2 VIEW

[chest pa]
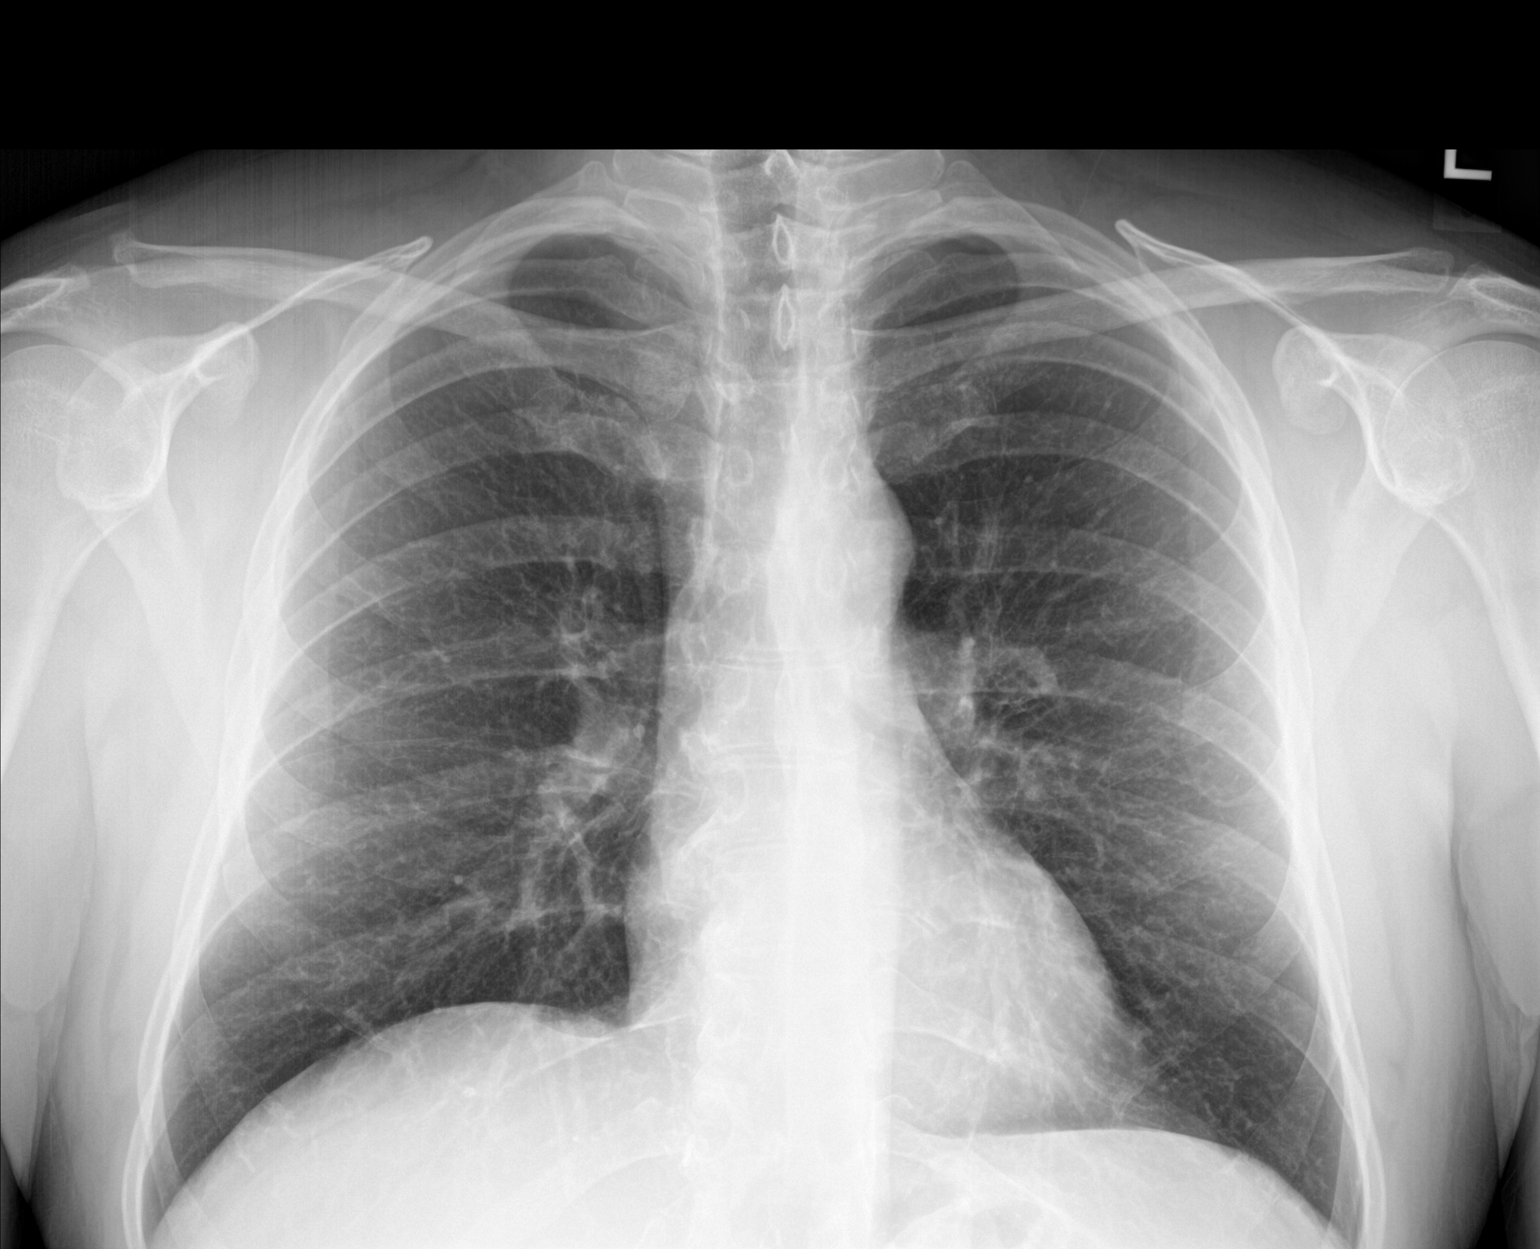

[chest lat]
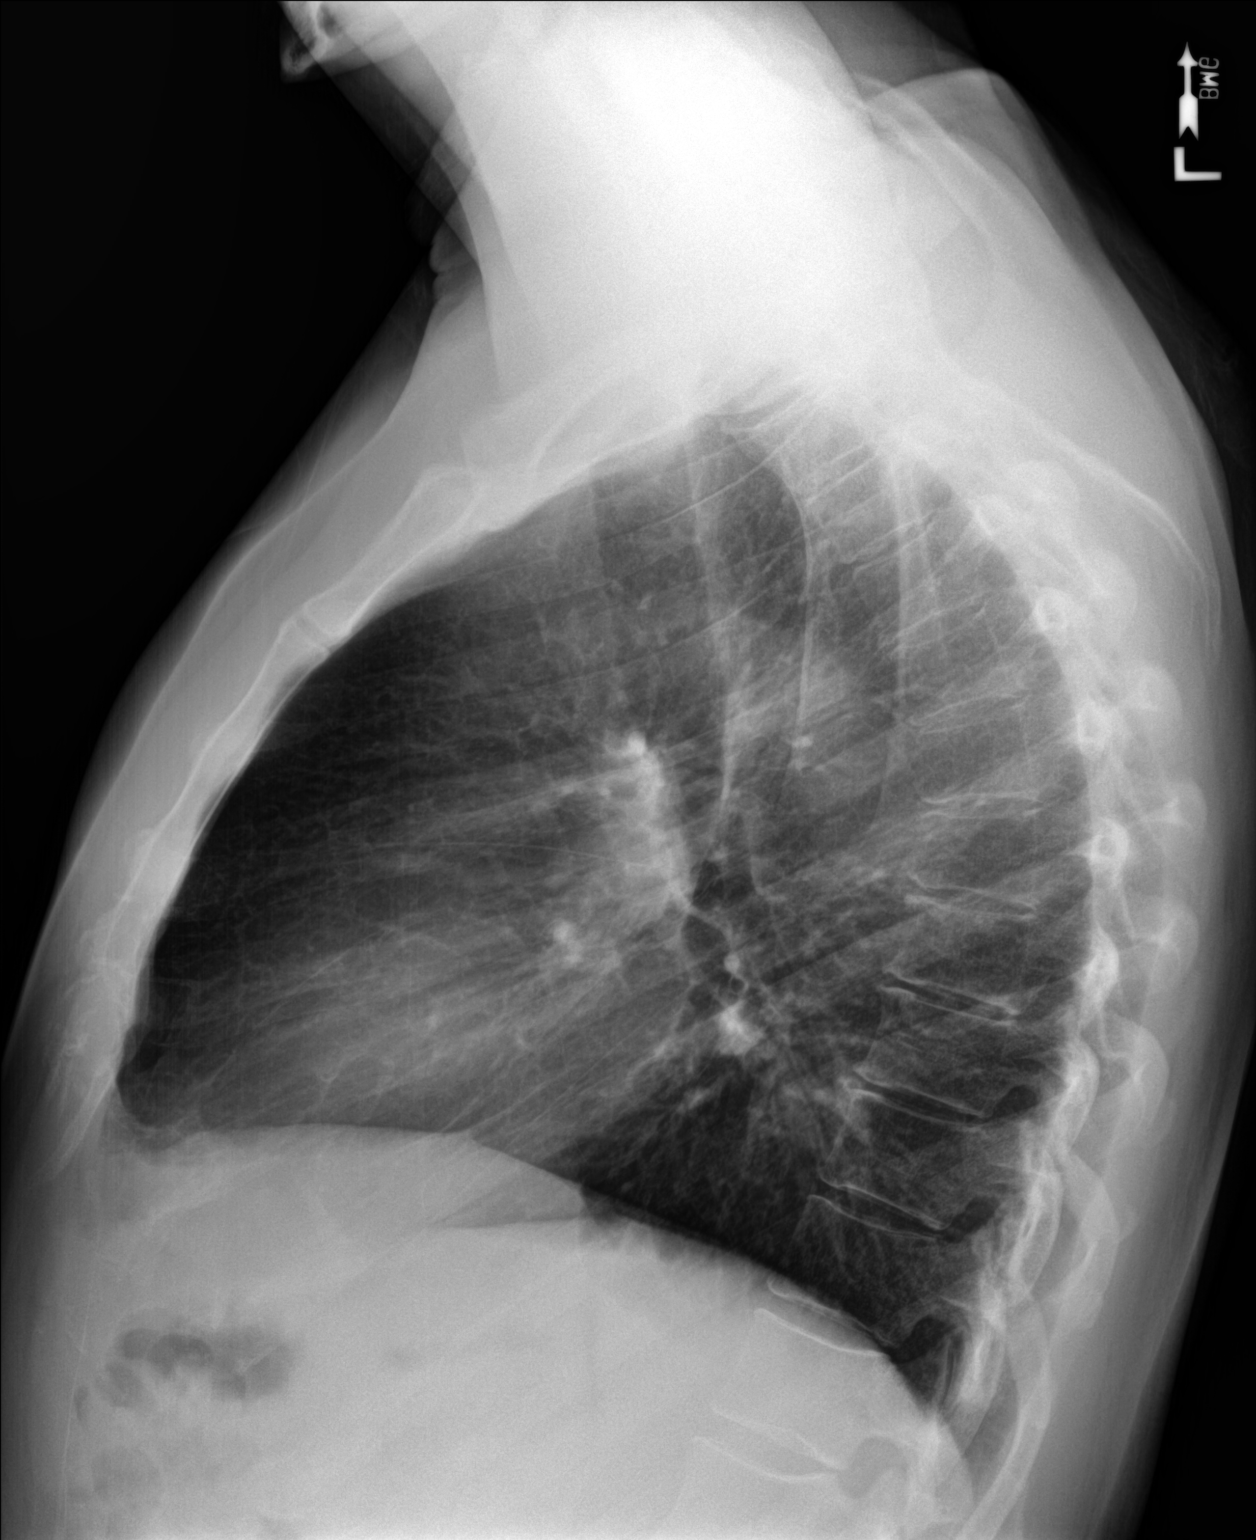

[2 of 2 positions shown; findings below may reference images not displayed]

FINDINGS: The heart size and mediastinal contours are within normal limits.
Both lungs are clear. The visualized skeletal structures are
unremarkable.
IMPRESSION: No active cardiopulmonary disease.
